# Patient Record
Sex: Male | Born: 1990 | Race: Black or African American | Hispanic: No | Marital: Single | Smoking: Former smoker
Health system: Southern US, Community
[De-identification: ages and names within clinical notes are randomized; demographics above are authoritative.]

## PROBLEM LIST (undated history)

## (undated) DIAGNOSIS — I509 Heart failure, unspecified: Secondary | ICD-10-CM

## (undated) DIAGNOSIS — J45909 Unspecified asthma, uncomplicated: Secondary | ICD-10-CM

## (undated) DIAGNOSIS — I639 Cerebral infarction, unspecified: Secondary | ICD-10-CM

## (undated) DIAGNOSIS — I219 Acute myocardial infarction, unspecified: Secondary | ICD-10-CM

## (undated) DIAGNOSIS — T7840XA Allergy, unspecified, initial encounter: Secondary | ICD-10-CM

## (undated) HISTORY — PX: KNEE SURGERY: SHX244

## (undated) HISTORY — PX: CORONARY STENT INTERVENTION: CATH118234

## (undated) HISTORY — DX: Unspecified asthma, uncomplicated: J45.909

## (undated) HISTORY — DX: Allergy, unspecified, initial encounter: T78.40XA

---
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
## 2013-08-25 ENCOUNTER — Ambulatory Visit: Payer: Federal, State, Local not specified - PPO

## 2013-08-25 ENCOUNTER — Ambulatory Visit (INDEPENDENT_AMBULATORY_CARE_PROVIDER_SITE_OTHER): Payer: Federal, State, Local not specified - PPO | Admitting: Physician Assistant

## 2013-08-25 VITALS — BP 135/88 | HR 77 | Temp 98.4°F | Resp 18 | Wt 274.0 lb

## 2013-08-25 DIAGNOSIS — S93602A Unspecified sprain of left foot, initial encounter: Secondary | ICD-10-CM

## 2013-08-25 DIAGNOSIS — S93609A Unspecified sprain of unspecified foot, initial encounter: Secondary | ICD-10-CM

## 2013-08-25 DIAGNOSIS — M25579 Pain in unspecified ankle and joints of unspecified foot: Secondary | ICD-10-CM

## 2013-08-25 DIAGNOSIS — M25572 Pain in left ankle and joints of left foot: Secondary | ICD-10-CM

## 2013-08-25 NOTE — Progress Notes (Signed)
  Subjective:    Patient ID: Nathan Chaney, male    DOB: October 13, 1991, 22 y.o.   MRN: [id number]  Ankle Pain    22 year old male presents for evaluation of left ankle pain s/p an injury last night while playing football.  States he was going for a tackle then 3 people subsequently fell on his left ankle causing an eversion injury.  Admits he heard and felt a pop. Has had continued pain and worsening bruising since the injury.  Has pain with weight bearing but has been walking on it.  No paresthesias or weakness.  Hx of ankle sprain in high school but no known fractures and no prior surgeries.  He has not taken any ibuprofen or tylenol. No ice applied yet.  Patient is healthy with no known medical problems.  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      Review of Systems  Musculoskeletal: Positive for arthralgias and gait problem (secondary to pain).  Skin: Positive for color change. Negative for rash and wound.       Objective:   Physical Exam  Constitutional: He is oriented to person, place, and time. He appears well-developed and well-nourished.  HENT:  Head: Normocephalic and atraumatic.  Right Ear: External ear normal.  Left Ear: External ear normal.  Eyes: Conjunctivae are normal.  Neck: Normal range of motion.  Cardiovascular: Normal rate.   Pulmonary/Chest: Effort normal.  Musculoskeletal:       Left ankle: He exhibits decreased range of motion (secondary to pain), swelling and ecchymosis (lateral and medial ankle). He exhibits no laceration and normal pulse. Tenderness. Lateral malleolus, medial malleolus and AITFL tenderness found.  Neurological: He is alert and oriented to person, place, and time.  Psychiatric: He has a normal mood and affect. His behavior is normal. Judgment and thought content normal.     [facility] reading (PRIMARY) by  Dr. Conley Rolls as no acute bony abnormality.       Assessment & Plan:  Pain in joint, ankle and foot, left - Plan: DG Ankle  Complete Left  Placed in tall Cam walker and crutches for comfort Recommend ibuprofen or tylenol as needed for pain Ice for 20 minutes 2-3 times daily. Elevate as much as possible Out of football until pain resolves.  Follow up here if symptoms are worsening or fail to improve.

## 2014-05-28 IMAGING — CR DG ANKLE COMPLETE 3+V*L*
2 series · 2 of 2 positions shown · non-contrast
Comparison: None.

CLINICAL DATA: Pain post trauma

EXAM:
LEFT ANKLE COMPLETE - 3+ VIEW

[AP]
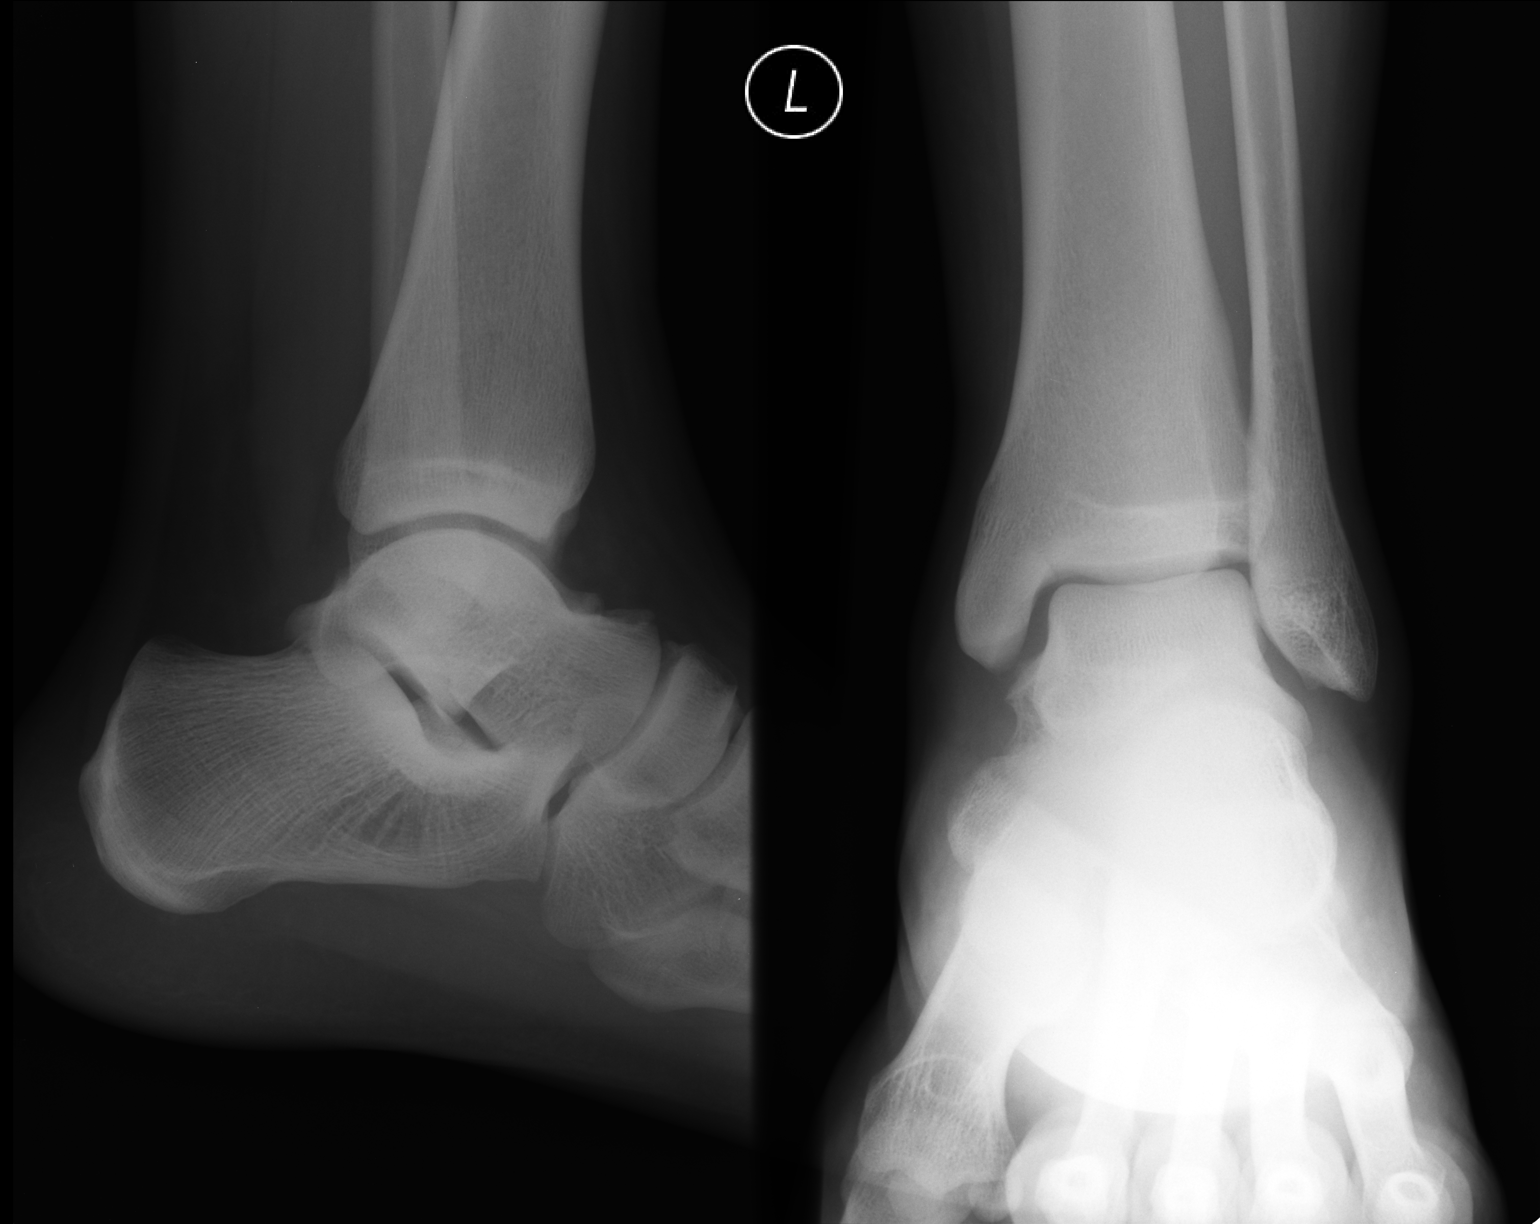

[ap obl int rot]
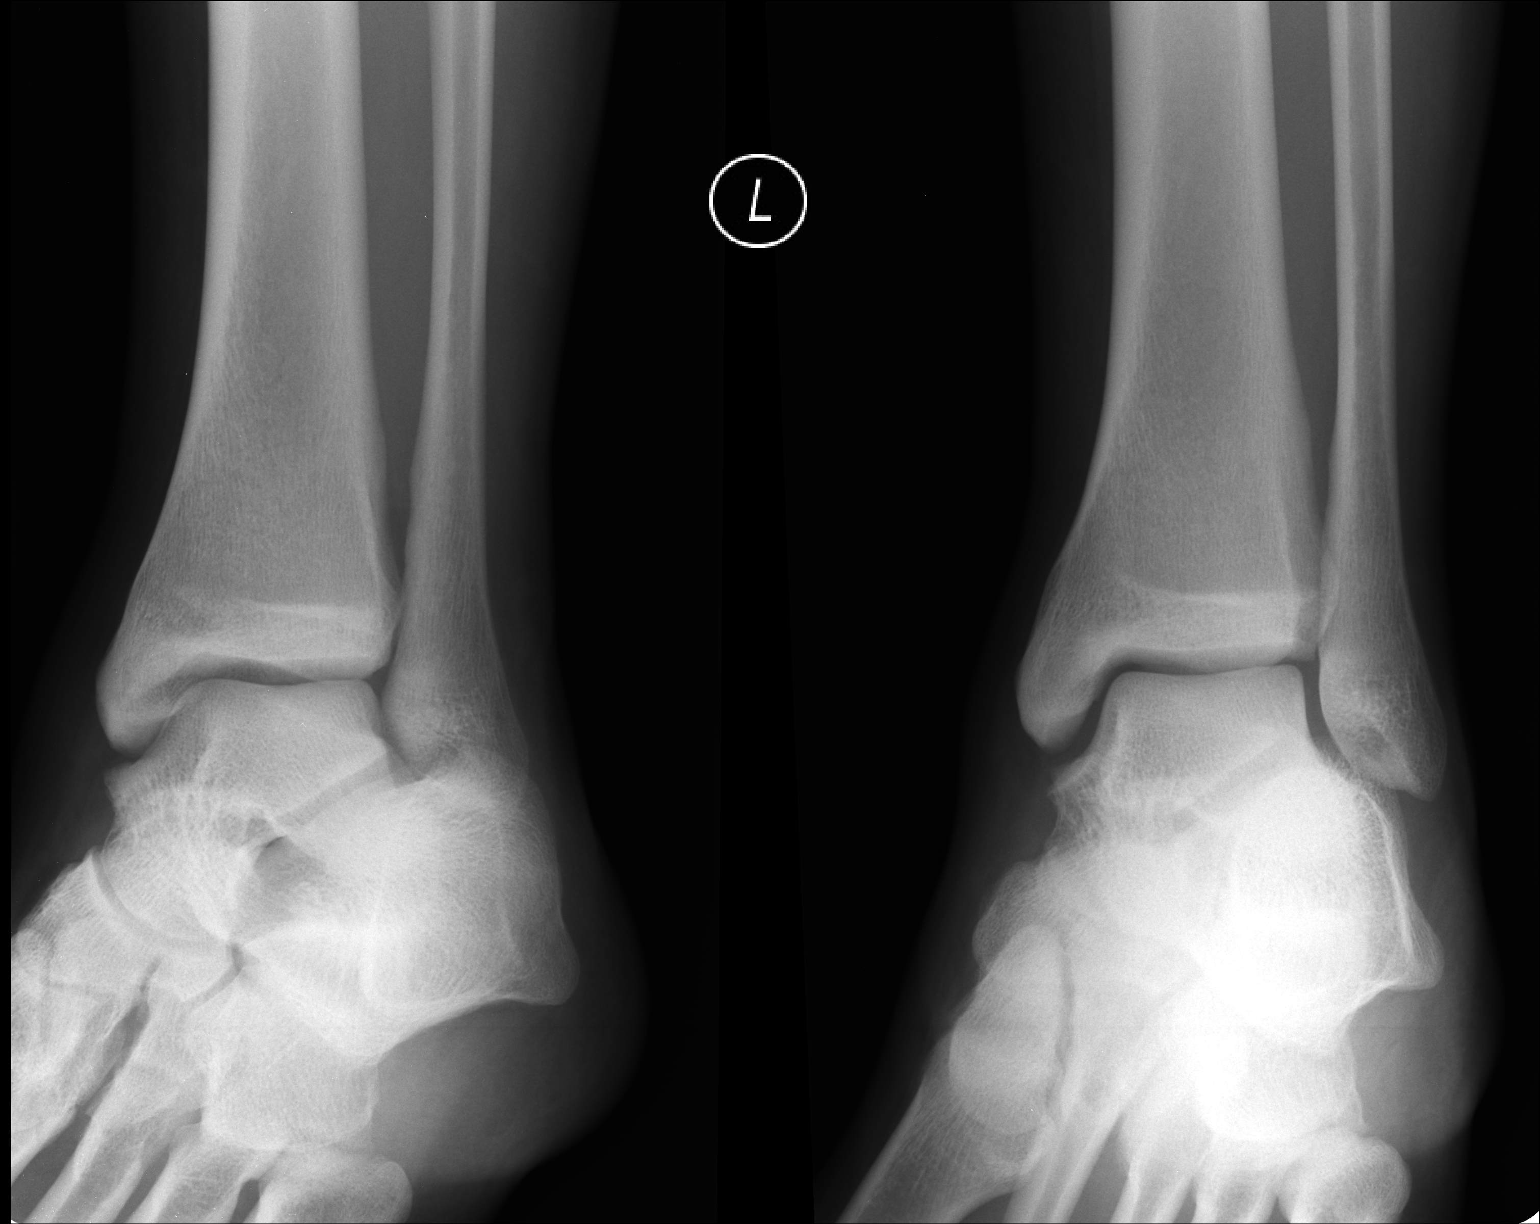

[2 of 2 positions shown; findings below may reference images not displayed]

FINDINGS: Frontal, oblique, and lateral views were obtained. There is no
fracture or effusion. Ankle mortise appears intact.
IMPRESSION: No abnormality noted.

## 2014-07-30 ENCOUNTER — Emergency Department (INDEPENDENT_AMBULATORY_CARE_PROVIDER_SITE_OTHER)
Admission: EM | Admit: 2014-07-30 | Discharge: 2014-07-30 | Disposition: A | Discharge status: Home / Self Care | Payer: Self-pay | Source: Home / Self Care | Attending: Emergency Medicine | Admitting: Emergency Medicine

## 2014-07-30 ENCOUNTER — Encounter (HOSPITAL_COMMUNITY): Payer: Self-pay | Admitting: Emergency Medicine

## 2014-07-30 DIAGNOSIS — J069 Acute upper respiratory infection, unspecified: Secondary | ICD-10-CM

## 2014-07-30 MED ORDER — IPRATROPIUM BROMIDE 0.06 % NA SOLN: 2.0000 | Freq: Four times a day (QID) | NASAL | Status: DC

## 2014-07-30 NOTE — ED Provider Notes (Signed)
CSN: [id number]     Arrival date & time 07/30/14  1418 History   First MD Initiated Contact with Patient 07/30/14 1505     Chief Complaint  Patient presents with  . URI   (Consider location/radiation/quality/duration/timing/severity/associated sxs/prior Treatment) Patient is a 23 y.o. male presenting with URI. The history is provided by the patient.  URI Presenting symptoms: congestion, cough and rhinorrhea   Presenting symptoms: no fatigue, no fever and no sore throat   Severity:  Mild Onset quality:  Gradual Duration:  2 days Timing:  Constant Progression:  Unchanged Chronicity:  New Associated symptoms: no arthralgias, no headaches, no myalgias, no neck pain, no sinus pain, no sneezing, no swollen glands and no wheezing     Past Medical History  Diagnosis Date  . Allergy   . Asthma    History reviewed. No pertinent past surgical history. No family history on file. History  Substance Use Topics  . Smoking status: Current Every Day Smoker    Types: Cigars  . Smokeless tobacco: Not on file  . Alcohol Use: Yes    Review of Systems  Constitutional: Negative for fever and fatigue.  HENT: Positive for congestion and rhinorrhea. Negative for sneezing and sore throat.   Respiratory: Positive for cough. Negative for wheezing.   Musculoskeletal: Negative for arthralgias, myalgias and neck pain.  Neurological: Negative for headaches.  All other systems reviewed and are negative.   Allergies  Review of patient's allergies indicates no known allergies.  Home Medications   Prior to Admission medications   Medication Sig Start Date End Date Taking? Authorizing Provider  ipratropium (ATROVENT) 0.06 % nasal spray Place 2 sprays into both nostrils 4 (four) times daily. 07/30/14   Jess BartersJennifer Lee H Presson, PA   BP 118/72  Pulse 78  Temp(Src) 98.6 F (37 C) (Oral)  Resp 16  SpO2 100% Physical Exam  Nursing note and vitals reviewed. Constitutional: He is oriented to person,  place, and time. He appears well-developed and well-nourished. No distress.  HENT:  Head: Normocephalic and atraumatic.  Right Ear: Hearing, tympanic membrane, external ear and ear canal normal.  Left Ear: Hearing, tympanic membrane, external ear and ear canal normal.  Nose: Mucosal edema and rhinorrhea present.  Mouth/Throat: Uvula is midline, oropharynx is clear and moist and mucous membranes are normal.  Eyes: Conjunctivae are normal. No scleral icterus.  Neck: Normal range of motion. Neck supple.  Cardiovascular: Normal rate, regular rhythm and normal heart sounds.   Pulmonary/Chest: Effort normal and breath sounds normal. No respiratory distress. He has no wheezes.  Musculoskeletal: Normal range of motion.  Lymphadenopathy:    He has no cervical adenopathy.  Neurological: He is alert and oriented to person, place, and time.  Skin: Skin is warm and dry.  Psychiatric: He has a normal mood and affect. His behavior is normal.    ED Course  Procedures (including critical care time) Labs Review Labs Reviewed - No data to display  Imaging Review No results found.   MDM   1. URI (upper respiratory infection)    Reports the symptom that bothers him the most is his nasal congestion. Will advise Delsym as directed on packaging for cough and will Rx Atrovent nasal spray for congestion along with additional symptomatic care at home. Follow up if no improvement over next 5-7 days.     Ria ClockJennifer Lee H Presson, GeorgiaPA 07/30/14 [phone]

## 2014-07-30 NOTE — ED Provider Notes (Signed)
Medical screening examination/treatment/procedure(s) were performed by non-physician practitioner and as supervising physician I was immediately available for consultation/collaboration.  Leslee Homeavid Judeen Geralds, M.D.  Reuben Likesavid C Rein Popov, MD 07/30/14 [phone]

## 2014-07-30 NOTE — Discharge Instructions (Signed)
Upper Respiratory Infection, Adult An upper respiratory infection (URI) is also sometimes known as the common cold. The upper respiratory tract includes the nose, sinuses, throat, trachea, and bronchi. Bronchi are the airways leading to the lungs. Most people improve within 1 week, but symptoms can last up to 2 weeks. A residual cough may last even longer.  CAUSES Many different viruses can infect the tissues lining the upper respiratory tract. The tissues become irritated and inflamed and often become very moist. Mucus production is also common. A cold is contagious. You can easily spread the virus to others by oral contact. This includes kissing, sharing a glass, coughing, or sneezing. Touching your mouth or nose and then touching a surface, which is then touched by another person, can also spread the virus. SYMPTOMS  Symptoms typically develop 1 to 3 days after you come in contact with a cold virus. Symptoms vary from person to person. They may include:  Runny nose.  Sneezing.  Nasal congestion.  Sinus irritation.  Sore throat.  Loss of voice (laryngitis).  Cough.  Fatigue.  Muscle aches.  Loss of appetite.  Headache.  Low-grade fever. DIAGNOSIS  You might diagnose your own cold based on familiar symptoms, since most people get a cold 2 to 3 times a year. Your caregiver can confirm this based on your exam. Most importantly, your caregiver can check that your symptoms are not due to another disease such as strep throat, sinusitis, pneumonia, asthma, or epiglottitis. Blood tests, throat tests, and X-rays are not necessary to diagnose a common cold, but they may sometimes be helpful in excluding other more serious diseases. Your caregiver will decide if any further tests are required. RISKS AND COMPLICATIONS  You may be at risk for a more severe case of the common cold if you smoke cigarettes, have chronic heart disease (such as heart failure) or lung disease (such as asthma), or if  you have a weakened immune system. The very young and very old are also at risk for more serious infections. Bacterial sinusitis, middle ear infections, and bacterial pneumonia can complicate the common cold. The common cold can worsen asthma and chronic obstructive pulmonary disease (COPD). Sometimes, these complications can require emergency medical care and may be life-threatening. PREVENTION  The best way to protect against getting a cold is to practice good hygiene. Avoid oral or hand contact with people with cold symptoms. Wash your hands often if contact occurs. There is no clear evidence that vitamin C, vitamin E, echinacea, or exercise reduces the chance of developing a cold. However, it is always recommended to get plenty of rest and practice good nutrition. TREATMENT  Treatment is directed at relieving symptoms. There is no cure. Antibiotics are not effective, because the infection is caused by a virus, not by bacteria. Treatment may include:  Increased fluid intake. Sports drinks offer valuable electrolytes, sugars, and fluids.  Breathing heated mist or steam (vaporizer or shower).  Eating chicken soup or other clear broths, and maintaining good nutrition.  Getting plenty of rest.  Using gargles or lozenges for comfort.  Controlling fevers with ibuprofen or acetaminophen as directed by your caregiver.  Increasing usage of your inhaler if you have asthma. Zinc gel and zinc lozenges, taken in the first 24 hours of the common cold, can shorten the duration and lessen the severity of symptoms. Pain medicines may help with fever, muscle aches, and throat pain. A variety of non-prescription medicines are available to treat congestion and runny nose. Your caregiver   can make recommendations and may suggest nasal or lung inhalers for other symptoms.  HOME CARE INSTRUCTIONS   Only take over-the-counter or prescription medicines for pain, discomfort, or fever as directed by your  caregiver.  Use a warm mist humidifier or inhale steam from a shower to increase air moisture. This may keep secretions moist and make it easier to breathe.  Drink enough water and fluids to keep your urine clear or pale yellow.  Rest as needed.  Return to work when your temperature has returned to normal or as your caregiver advises. You may need to stay home longer to avoid infecting others. You can also use a face mask and careful hand washing to prevent spread of the virus. SEEK MEDICAL CARE IF:   After the first few days, you feel you are getting worse rather than better.  You need your caregiver's advice about medicines to control symptoms.  You develop chills, worsening shortness of breath, or brown or red sputum. These may be signs of pneumonia.  You develop yellow or brown nasal discharge or pain in the face, especially when you bend forward. These may be signs of sinusitis.  You develop a fever, swollen neck glands, pain with swallowing, or white areas in the back of your throat. These may be signs of strep throat. SEEK IMMEDIATE MEDICAL CARE IF:   You have a fever.  You develop severe or persistent headache, ear pain, sinus pain, or chest pain.  You develop wheezing, a prolonged cough, cough up blood, or have a change in your usual mucus (if you have chronic lung disease).  You develop sore muscles or a stiff neck. Document Released: 04/12/2001 Document Revised: 01/09/2012 Document Reviewed: 01/22/2014 ExitCare Patient Information 2015 [employer]. This information is not intended to replace advice given to you by your health care provider. Make sure you discuss any questions you have with your health care provider.  

## 2014-07-30 NOTE — ED Notes (Signed)
Py  Reports  Symptoms  Of            Cough  Congestion     stuffynose          With  Symptoms     X  2  Days    History  Of  Asthma    With  No  Acute  Distress          Sitting  Upright on  The  Exam table  In no  Acute  Distress

## 2015-08-17 ENCOUNTER — Other Ambulatory Visit: Payer: Self-pay | Admitting: Physician Assistant

## 2015-08-17 NOTE — H&P (Signed)
Fayrene FearingJames is seen in consultation today at the request of Dr. Farris HasKramer for evaluation and treatment recommendation for his left knee.  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  Markedly exacerbated when he came down awkwardly at work sustaining an acute dislocation.  This has been evaluated by Dr. Farris HasKramer.  It has included x-rays, as well as MRI scan.  All of those reviewed.  He is playing semipro football and wants to return to a level to be able to continue that if possible.  No previous operative intervention.  Looking at the actual MRI which was from July, there was obviously a relatively marked acute event with significant edema lateral femoral condyle.  Very stretched, thinned, markedly attenuated medial patellofemoral ligament.   Remaining history and general exam reviewed.  Otherwise in great health.     EXAMINATION: Clinically he has marked patellofemoral instability and apprehension on the left.  Really none on the right.  His Q angles are not that excessive.  Patellar tendon and ligaments otherwise intact.  I think he tethers a little bit from the chronic situation on the left, but this is not too dramatic.  On the right his patella feels good.  His tracking is good.  There is no crepitus.  No tethering.  Neurovascularly intact distally.    X-RAYS: Ultrasound evaluation was performed.  Looking at the medial side of his knee, what is left of his medial patellofemoral ligament is so thinned and attenuated that really no structural ligament could be seen.  DISPOSITION:  Long talk with Fayrene FearingJames.  Need for treatment straightforward.  After his acute traumatic event this is now intolerable, even with activities of daily living.  There is no way he is going to get back to a high level of activity short of reconstruction and it really needs to be done because of his degree of symptoms.  I am very concerned about how much destruction he is doing to his patella with these recurrent events.  We  have discussed this at great length, more than 25 minutes spent face-to-face.  Exam under anesthesia, arthroscopy.  Open medial patellofemoral ligament reconstruction utilizing allograft.  Adding lateral release if necessary.  Chondroplasty patella as well.  Anticipated rehab, recovery and outcome outlined.  At the patient's request, I have also spoken to his mother on the phone explaining all of this to her as well.  Paperwork complete.  All questions answered.  See him at the time of operative intervention.      Loreta Aveaniel F. Murphy, M.D.

## 2015-09-10 ENCOUNTER — Encounter (HOSPITAL_BASED_OUTPATIENT_CLINIC_OR_DEPARTMENT_OTHER): Admission: RE | Payer: Self-pay | Source: Ambulatory Visit

## 2015-09-10 ENCOUNTER — Ambulatory Visit (HOSPITAL_BASED_OUTPATIENT_CLINIC_OR_DEPARTMENT_OTHER): Admission: RE | Admit: 2015-09-10 | Payer: Self-pay | Source: Ambulatory Visit | Admitting: Orthopedic Surgery

## 2015-09-10 SURGERY — RECONSTRUCTION, LIGAMENT, MEDIAL PATELLOFEMORAL
Anesthesia: General | Site: Knee | Laterality: Left

## 2016-04-25 ENCOUNTER — Encounter (HOSPITAL_BASED_OUTPATIENT_CLINIC_OR_DEPARTMENT_OTHER): Payer: Self-pay

## 2016-04-25 ENCOUNTER — Emergency Department (HOSPITAL_BASED_OUTPATIENT_CLINIC_OR_DEPARTMENT_OTHER)
Admission: EM | Admit: 2016-04-25 | Discharge: 2016-04-25 | Disposition: A | Discharge status: Home / Self Care | Payer: Self-pay | Attending: Emergency Medicine | Admitting: Emergency Medicine

## 2016-04-25 DIAGNOSIS — Z87891 Personal history of nicotine dependence: Secondary | ICD-10-CM | POA: Insufficient documentation

## 2016-04-25 DIAGNOSIS — K029 Dental caries, unspecified: Secondary | ICD-10-CM | POA: Insufficient documentation

## 2016-04-25 DIAGNOSIS — J45909 Unspecified asthma, uncomplicated: Secondary | ICD-10-CM | POA: Insufficient documentation

## 2016-04-25 DIAGNOSIS — R51 Headache: Secondary | ICD-10-CM

## 2016-04-25 DIAGNOSIS — R519 Headache, unspecified: Secondary | ICD-10-CM

## 2016-04-25 DIAGNOSIS — Z791 Long term (current) use of non-steroidal anti-inflammatories (NSAID): Secondary | ICD-10-CM | POA: Insufficient documentation

## 2016-04-25 DIAGNOSIS — Z79891 Long term (current) use of opiate analgesic: Secondary | ICD-10-CM | POA: Insufficient documentation

## 2016-04-25 MED ORDER — NAPROXEN 375 MG PO TABS: 375.0000 mg | ORAL_TABLET | Freq: Two times a day (BID) | ORAL | Status: DC

## 2016-04-25 MED ORDER — TRAMADOL HCL 50 MG PO TABS: 50.0000 mg | ORAL_TABLET | Freq: Four times a day (QID) | ORAL | Status: DC | PRN

## 2016-04-25 NOTE — ED Notes (Signed)
Pt c/o left side tooth ache x 1 month and thinks that is causing a headache that he has had for 2 days

## 2016-04-25 NOTE — ED Provider Notes (Signed)
CSN: [id number]     Arrival date & time 04/25/16  1824 History   First MD Initiated Contact with Patient 04/25/16 2101     Chief Complaint  Patient presents with  . Headache     (Consider location/radiation/quality/duration/timing/severity/associated sxs/prior Treatment) HPI  Nathan SchickJames E Meche Jr. is a(n) 25 y.o. male who presents with cc of dental pain and headache. He has hadHad it in intermittent headaches from the left side of his upper jaw, into his ear and face with associated photophobia and very bright light. Patient has been using over-the-counter medications without relief of his symptoms. The pain is made worse with biting, hot or cold on the molar, he denies difficulty swallowing or breathing. He denies fevers or chills. Denies photophobia, phonophobia, UL throbbing, N/V, visual changes, stiff neck, neck pain, rash, or "thunderclap" onset.     Past Medical History  Diagnosis Date  . Allergy   . Asthma    Past Surgical History  Procedure Laterality Date  . Knee surgery     No family history on file. Social History  Substance Use Topics  . Smoking status: Former Games developermoker  . Smokeless tobacco: None  . Alcohol Use: Yes     Comment: occ    Review of Systems  Ten systems reviewed and are negative for acute change, except as noted in the HPI.     Allergies  Mustard seed  Home Medications   Prior to Admission medications   Medication Sig Start Date End Date Taking? Authorizing Provider  HYDROcodone-acetaminophen (NORCO/VICODIN) 5-325 MG tablet Take 1 tablet by mouth every 6 (six) hours as needed for moderate pain.   Yes Historical Provider, MD  ibuprofen (ADVIL,MOTRIN) 800 MG tablet Take 800 mg by mouth every 8 (eight) hours as needed.   Yes Historical Provider, MD   BP 129/92 mmHg  Pulse 70  Temp(Src) 98.5 F (36.9 C) (Oral)  Resp 18  Ht 6\' 3"  (1.905 m)  Wt 131.543 kg  BMI 36.25 kg/m2  SpO2 99% Physical Exam  Constitutional: He is oriented to person, place,  and time. He appears well-developed and well-nourished. No distress.  HENT:  Head: Normocephalic and atraumatic.  Mouth/Throat: Oropharynx is clear and moist.  Left upper second molar with sig decay and old tooth fracture.  no signs of abscess, Oropharynx without swelling.    Eyes: Conjunctivae and EOM are normal. Pupils are equal, round, and reactive to light. No scleral icterus.  No horizontal, vertical or rotational nystagmus  Neck: Normal range of motion. Neck supple.  Full active and passive ROM without pain No midline or paraspinal tenderness No nuchal rigidity or meningeal signs  Cardiovascular: Normal rate, regular rhythm and intact distal pulses.  Exam reveals no friction rub.   Pulmonary/Chest: Effort normal and breath sounds normal. No respiratory distress. He has no wheezes. He has no rales.  Abdominal: Soft. Bowel sounds are normal. There is no tenderness. There is no rebound and no guarding.  Musculoskeletal: Normal range of motion.  Lymphadenopathy:    He has no cervical adenopathy.  Neurological: He is alert and oriented to person, place, and time. He has normal reflexes. No cranial nerve deficit. He exhibits normal muscle tone. Coordination normal.  Mental Status:  Alert, oriented, thought content appropriate. Speech fluent without evidence of aphasia. Able to follow 2 step commands without difficulty.  Cranial Nerves:  II:  Peripheral visual fields grossly normal, pupils equal, round, reactive to light III,IV, VI: ptosis not present, extra-ocular motions intact bilaterally  V,VII: smile symmetric, facial light touch sensation equal VIII: hearing grossly normal bilaterally  IX,X: midline uvula rise  XI: bilateral shoulder shrug equal and strong XII: midline tongue extension  Motor:  5/5 in upper and lower extremities bilaterally including strong and equal grip strength and dorsiflexion/plantar flexion Sensory: Pinprick and light touch normal in all extremities.   Deep Tendon Reflexes: 2+ and symmetric  Cerebellar: normal finger-to-nose with bilateral upper extremities Gait: normal gait and balance CV: distal pulses palpable throughout   Skin: Skin is warm and dry. No rash noted. He is not diaphoretic.  Psychiatric: He has a normal mood and affect. His behavior is normal. Judgment and thought content normal.  Nursing note and vitals reviewed.   ED Course  Procedures (including critical care time) Labs Review Labs Reviewed - No data to display  Imaging Review No results found. I have personally reviewed and evaluated these images and lab results as part of my medical decision-making.   EKG Interpretation None      MDM   Final diagnoses:  Pain due to dental caries  Bad headache   Pt HA  not concerning for SAH, ICH, Meningitis, or temporal arteritis. Pt is afebrile with no focal neuro deficits, nuchal rigidity, or change in vision.  Patient with toothache.  No gross abscess.  Exam unconcerning for Ludwig's angina or spread of infection.  Will treat with pain medicine.  Urged patient to follow-up with dentist.    .    Arthor CaptainAbigail Farrell Broerman, PA-C 04/25/16 [id number]  Alvira MondayErin Schlossman, MD 04/26/16 [phone]

## 2016-04-25 NOTE — Discharge Instructions (Signed)
SEEK MEDICAL ATTENTION IF: You develop possible problems with medications prescribed.  The medications don't resolve your headache, if it recurs , or if you have multiple episodes of vomiting or can't take fluids. You have a change from the usual headache. RETURN IMMEDIATELY IF you develop a sudden, severe headache or confusion, become poorly responsive or faint, develop a fever above 100.74F or problem breathing, have a change in speech, vision, swallowing, or understanding, or develop new weakness, numbness, tingling, incoordination, or have a seizure.   Dental Caries Dental caries (also called tooth decay) is the most common oral disease. It can occur at any age but is more common in children and young adults.  HOW DENTAL CARIES DEVELOPS  The process of decay begins when bacteria and foods (particularly sugars and starches) combine in your mouth to produce plaque. Plaque is a substance that sticks to the hard, outer surface of a tooth (enamel). The bacteria in plaque produce acids that attack enamel. These acids may also attack the root surface of a tooth (cementum) if it is exposed. Repeated attacks dissolve these surfaces and create holes in the tooth (cavities). If left untreated, the acids destroy the other layers of the tooth.  RISK FACTORS  Frequent sipping of sugary beverages.   Frequent snacking on sugary and starchy foods, especially those that easily get stuck in the teeth.   Poor oral hygiene.   Dry mouth.   Substance abuse such as methamphetamine abuse.   Broken or poor-fitting dental restorations.   Eating disorders.   Gastroesophageal reflux disease (GERD).   Certain radiation treatments to the head and neck. SYMPTOMS In the early stages of dental caries, symptoms are seldom present. Sometimes white, chalky areas may be seen on the enamel or other tooth layers. In later stages, symptoms may include:  Pits and holes on the enamel.  Toothache after sweet,  hot, or cold foods or drinks are consumed.  Pain around the tooth.  Swelling around the tooth. DIAGNOSIS  Most of the time, dental caries is detected during a regular dental checkup. A diagnosis is made after a thorough medical and dental history is taken and the surfaces of your teeth are checked for signs of dental caries. Sometimes special instruments, such as lasers, are used to check for dental caries. Dental X-ray exams may be taken so that areas not visible to the eye (such as between the contact areas of the teeth) can be checked for cavities.  TREATMENT  If dental caries is in its early stages, it may be reversed with a fluoride treatment or an application of a remineralizing agent at the dental office. Thorough brushing and flossing at home is needed to aid these treatments. If it is in its later stages, treatment depends on the location and extent of tooth destruction:   If a small area of the tooth has been destroyed, the destroyed area will be removed and cavities will be filled with a material such as gold, silver amalgam, or composite resin.   If a large area of the tooth has been destroyed, the destroyed area will be removed and a cap (crown) will be fitted over the remaining tooth structure.   If the center part of the tooth (pulp) is affected, a procedure called a root canal will be needed before a filling or crown can be placed.   If most of the tooth has been destroyed, the tooth may need to be pulled (extracted). HOME CARE INSTRUCTIONS You can prevent, stop, or  reverse dental caries at home by practicing good oral hygiene. Good oral hygiene includes:  Thoroughly cleaning your teeth at least twice a day with a toothbrush and dental floss.   Using a fluoride toothpaste. A fluoride mouth rinse may also be used if recommended by your dentist or health care provider.   Restricting the amount of sugary and starchy foods and sugary liquids you consume.   Avoiding  frequent snacking on these foods and sipping of these liquids.   Keeping regular visits with a dentist for checkups and cleanings. PREVENTION   Practice good oral hygiene.  Consider a dental sealant. A dental sealant is a coating material that is applied by your dentist to the pits and grooves of teeth. The sealant prevents food from being trapped in them. It may protect the teeth for several years.  Ask about fluoride supplements if you live in a community without fluorinated water or with water that has a low fluoride content. Use fluoride supplements as directed by your dentist or health care provider.  Allow fluoride varnish applications to teeth if directed by your dentist or health care provider.   This information is not intended to replace advice given to you by your health care provider. Make sure you discuss any questions you have with your health care provider.   Document Released: 07/09/2002 Document Revised: 11/07/2014 Document Reviewed: 10/19/2012 Elsevier Interactive Patient Education 2016 Elsevier Inc.  Dental Pain Dental pain may be caused by many things, including:  Tooth decay (cavities or caries). Cavities expose the nerve of your tooth to air and hot or cold temperatures. This can cause pain or discomfort.  Abscess or infection. A dental abscess is a collection of infected pus from a bacterial infection in the inner part of the tooth (pulp). It usually occurs at the end of the tooth's root.  Injury.  An unknown reason (idiopathic). Your pain may be mild or severe. It may only occur when:  You are chewing.  You are exposed to hot or cold temperature.  You are eating or drinking sugary foods or beverages, such as soda or candy. Your pain may also be constant. HOME CARE INSTRUCTIONS Watch your dental pain for any changes. The following actions may help to lessen any discomfort that you are feeling:  Take medicines only as directed by your dentist.  If you  were prescribed an antibiotic medicine, finish all of it even if you start to feel better.  Keep all follow-up visits as directed by your dentist. This is important.  Do not apply heat to the outside of your face.  Rinse your mouth or gargle with salt water if directed by your dentist. This helps with pain and swelling.  You can make salt water by adding  tsp of salt to 1 cup of warm water.  Apply ice to the painful area of your face:  Put ice in a plastic bag.  Place a towel between your skin and the bag.  Leave the ice on for 20 minutes, 2-3 times per day.  Avoid foods or drinks that cause you pain, such as:  Very hot or very cold foods or drinks.  Sweet or sugary foods or drinks. SEEK MEDICAL CARE IF:  Your pain is not controlled with medicines.  Your symptoms are worse.  You have new symptoms. SEEK IMMEDIATE MEDICAL CARE IF:  You are unable to open your mouth.  You are having trouble breathing or swallowing.  You have a fever.  Your face,  neck, or jaw is swollen.   This information is not intended to replace advice given to you by your health care provider. Make sure you discuss any questions you have with your health care provider.   Document Released: 10/17/2005 Document Revised: 03/03/2015 Document Reviewed: 10/13/2014 Elsevier Interactive Patient Education 2016 Elsevier Inc. [facility] of Dental Medicine  Community Service Learning [facility]  [address]  [place], [place] [address]  Phone [phone]  The [employer] [facility] in [place] exemplifies the [employer] vision to improve the health and quality of life of all [place]ians by Public house manager with a passion to care for the underserved and by leading the nation in community-based, service learning oral health education. We are committed to offering comprehensive  general dental services for adults, children and special needs patients in a safe, caring and professional setting.  Appointments: Our clinic is open Monday through Friday 8:00 a.m. until 5:00 p.m. The amount of time scheduled for an appointment depends on the patients specific needs. We ask that you keep your appointed time for care or provide 24-hour notice of all appointment changes. Parents or legal guardians must accompany minor children.  Payment for Services: Medicaid and other insurance plans are welcome. Payment for services is due when services are rendered and may be made by cash or credit card. If you have dental insurance, we will assist you with your claim submission.   Emergencies: Emergency services will be provided Monday through Friday on a walk-in basis. Please arrive early for emergency services. After hours emergency services will be provided for patients of record as required.  Services:  Medical illustrator Dentistry  Oral Surgery - Extractions  Root Canals  Sealants and Tooth Colored Fillings  Crowns and Bridges  Dentures and Partial Dentures  Implant Services  Periodontal Services and Cleanings  Cosmetic Building services engineer  [employer]  [employer] Guide Dental [employer] is a great source of information about community services available.  Access by dialing 2-1-1 from anywhere in [place], or by website -  PooledIncome.pl.   Other Local Resources (Updated 11/2015)  Dental  Care   Services    Phone Number and Address  Cost  [facility] For children 53 - 25 years of age:   Cleaning  Tooth brushing/flossing instruction  Sealants, fillings, crowns  Extractions  Emergency treatment  [phone] [address] [place], [place] [address] Charges based on family income.  Medicaid and some insurance plans accepted.     [facility] - [employer], fillings, crowns  Extractions  Emergency treatment [phone] [address] [place], [place]  Pregnant women 16 years of age or older with a Medicaid card  [place] - [place]  Cleaning  Sealants, fillings, crowns  Extractions  Emergency treatment [phone] [address] [place], [place] Pregnant women 81 years of age or older with a Medicaid card  [facility] For children 27 - 54 years of age:   Cleaning  Tooth brushing/flossing instruction  Sealants, fillings, crowns  Extractions  Emergency treatment Limited orthodontic services for patients with Medicaid [phone] [address] [place], [place] [address] Medicaid and [facility] Health Choice cover for children up to age 106 and pregnant women.  Parents of children up to age 26 without Medicaid pay a reduced fee at time of service.  [facility] [place] For children 390 - 26 years of age:   Cleaning  Tooth brushing/flossing instruction  Sealants, fillings, crowns  Extractions  Emergency treatment Limited orthodontic services for patients with Medicaid [phone] [address] [place], [place].  Medicaid and [place] Health Choice cover for children up to age 26 and pregnant women.  Parents of children up to age 26 without Medicaid pay a reduced fee.  [facility]  Cleaning  Sealants, fillings, crowns  Extractions  Hours: Tuesdays and Thursdays, 4:15 - 8 pm [phone] [address] [place], [place] [address] Services free of charge to [facility] residents ages 18-64 who do not have health insurance, Medicare, [place], or [place] benefits and fall within federal poverty guidelines  [facility]    Provides dental care in addition to primary medical care,  nutritional counseling, and pharmacy:  Nurse, mental healthCleaning  Sealants, fillings, crowns  Extractions                  [phone] [facility], [address] [place], [place]  [phone] Phineas Realharles Drew [facility], [address] [place], [place]  [phone] [facility] [place], [place]  [phone] [facility], [address] [place], [place]  [phone] [facility] [address] [place], [place] Accepts [place], [place], most insurance.  Also provides services available to all with fees adjusted based on ability to pay.    [facility]  Cleaning  Tooth brushing/flossing instruction  Sealants, fillings, crowns  Extractions  Emergency treatment Hours: Tuesdays, Thursdays, and Fridays from 8 am to 5 pm by appointment only. [phone] [address] [place], [place] [id number] [facility] residents with Medicaid (depending on eligibility) and children with [facility] Health Choice - call for more information.  [facility]  Extractions only  Hours: 2nd and 4th Thursday of each month from 6:30 am - 9 am.   [phone] [address] [place], [place] [address] Ages 8418 and older only.  Patients are seen on a first come, first served basis.  [employer]  [employer]  Fillings  Extractions  Orthodontics  Endodontics  Implants/Crowns/Bridges  Complete and partial dentures [phone] [place] Patients must complete an application for services.  There is often a waiting list.

## 2016-04-25 NOTE — ED Notes (Signed)
C/o pain to left side HA x 2 days (temporal and head-thinks may be r/t to "a bad tooth")-denies injury, visual disturbance-NAD-steady gait

## 2016-05-21 ENCOUNTER — Emergency Department (HOSPITAL_COMMUNITY)
Admission: EM | Admit: 2016-05-21 | Discharge: 2016-05-21 | Disposition: A | Discharge status: Home / Self Care | Payer: Self-pay | Attending: Emergency Medicine | Admitting: Emergency Medicine

## 2016-05-21 ENCOUNTER — Encounter (HOSPITAL_COMMUNITY): Payer: Self-pay | Admitting: Emergency Medicine

## 2016-05-21 DIAGNOSIS — Z87891 Personal history of nicotine dependence: Secondary | ICD-10-CM | POA: Insufficient documentation

## 2016-05-21 DIAGNOSIS — R197 Diarrhea, unspecified: Secondary | ICD-10-CM | POA: Insufficient documentation

## 2016-05-21 DIAGNOSIS — J45909 Unspecified asthma, uncomplicated: Secondary | ICD-10-CM | POA: Insufficient documentation

## 2016-05-21 DIAGNOSIS — R112 Nausea with vomiting, unspecified: Secondary | ICD-10-CM | POA: Insufficient documentation

## 2016-05-21 DIAGNOSIS — Z79899 Other long term (current) drug therapy: Secondary | ICD-10-CM | POA: Insufficient documentation

## 2016-05-21 LAB — COMPREHENSIVE METABOLIC PANEL
ALT: 21 U/L (ref 17–63)
AST: 21 U/L (ref 15–41)
Albumin: 4.2 g/dL (ref 3.5–5.0)
Alkaline Phosphatase: 65 U/L (ref 38–126)
Anion gap: 5 (ref 5–15)
BUN: 11 mg/dL (ref 6–20)
CO2: 29 mmol/L (ref 22–32)
Calcium: 9.3 mg/dL (ref 8.9–10.3)
Chloride: 106 mmol/L (ref 101–111)
Creatinine, Ser: 1.37 mg/dL — ABNORMAL HIGH (ref 0.61–1.24)
Glucose, Bld: 98 mg/dL (ref 65–99)
POTASSIUM: 4.1 mmol/L (ref 3.5–5.1)
SODIUM: 140 mmol/L (ref 135–145)
Total Bilirubin: 1.2 mg/dL (ref 0.3–1.2)
Total Protein: 6.8 g/dL (ref 6.5–8.1)

## 2016-05-21 LAB — CBC
HEMATOCRIT: 42.9 % (ref 39.0–52.0)
HEMOGLOBIN: 14.4 g/dL (ref 13.0–17.0)
MCH: 29.1 pg (ref 26.0–34.0)
MCHC: 33.6 g/dL (ref 30.0–36.0)
MCV: 86.8 fL (ref 78.0–100.0)
PLATELETS: 189 10*3/uL (ref 150–400)
RBC: 4.94 MIL/uL (ref 4.22–5.81)
RDW: 13.3 % (ref 11.5–15.5)
WBC: 5.6 10*3/uL (ref 4.0–10.5)

## 2016-05-21 LAB — URINALYSIS, ROUTINE W REFLEX MICROSCOPIC
Bilirubin Urine: NEGATIVE
GLUCOSE, UA: NEGATIVE mg/dL
HGB URINE DIPSTICK: NEGATIVE
Ketones, ur: NEGATIVE mg/dL
LEUKOCYTES UA: NEGATIVE
Nitrite: NEGATIVE
PH: 5.5 (ref 5.0–8.0)
PROTEIN: NEGATIVE mg/dL
SPECIFIC GRAVITY, URINE: 1.017 (ref 1.005–1.030)

## 2016-05-21 LAB — LIPASE, BLOOD: LIPASE: 34 U/L (ref 11–51)

## 2016-05-21 MED ORDER — ONDANSETRON 4 MG PO TBDP: 4.0000 mg | ORAL_TABLET | Freq: Three times a day (TID) | ORAL | Status: DC | PRN

## 2016-05-21 NOTE — ED Notes (Signed)
EDP at bedside  

## 2016-05-21 NOTE — ED Notes (Signed)
Pt. reports emesis , diarrhea and mild abdominal discomfort onset last night , denies fever or chills.

## 2016-05-21 NOTE — ED Provider Notes (Signed)
CSN: [id number]     Arrival date & time 05/21/16  0145 History   First MD Initiated Contact with Patient 05/21/16 0408     Chief Complaint  Patient presents with  . Emesis  . Diarrhea     (Consider location/radiation/quality/duration/timing/severity/associated sxs/prior Treatment) Patient is a 25 y.o. male presenting with vomiting, diarrhea, and general illness. The history is provided by the patient.  Emesis Associated symptoms: abdominal pain and diarrhea   Associated symptoms: no arthralgias, no chills, no headaches and no myalgias   Diarrhea Associated symptoms: abdominal pain and vomiting   Associated symptoms: no arthralgias, no chills, no fever, no headaches and no myalgias   Illness Severity:  Mild Onset quality:  Sudden Duration:  2 hours Timing:  Constant Progression:  Partially resolved Chronicity:  New Associated symptoms: abdominal pain, diarrhea, nausea and vomiting   Associated symptoms: no chest pain, no congestion, no fever, no headaches, no myalgias, no rash and no shortness of breath    25 yo M With a chief complaints of nausea vomiting diarrhea. This went on for a few short hours after having Dione Plover. He thinks his mostly resolved. He is specifically asked for a work note. Denies fevers or chills. Denies bloody diarrhea. Denies bilious emesis.  Past Medical History  Diagnosis Date  . Allergy   . Asthma    Past Surgical History  Procedure Laterality Date  . Knee surgery     No family history on file. Social History  Substance Use Topics  . Smoking status: Former Games developer  . Smokeless tobacco: None  . Alcohol Use: Yes     Comment: occ    Review of Systems  Constitutional: Negative for fever and chills.  HENT: Negative for congestion and facial swelling.   Eyes: Negative for discharge and visual disturbance.  Respiratory: Negative for shortness of breath.   Cardiovascular: Negative for chest pain and palpitations.  Gastrointestinal: Positive for  nausea, vomiting, abdominal pain and diarrhea.  Musculoskeletal: Negative for myalgias and arthralgias.  Skin: Negative for color change and rash.  Neurological: Negative for tremors, syncope and headaches.  Psychiatric/Behavioral: Negative for confusion and dysphoric mood.      Allergies  Mustard seed  Home Medications   Prior to Admission medications   Medication Sig Start Date End Date Taking? Authorizing Provider  naproxen (NAPROSYN) 375 MG tablet Take 1 tablet (375 mg total) by mouth 2 (two) times daily. 04/25/16   Arthor Captain, PA-C  ondansetron (ZOFRAN ODT) 4 MG disintegrating tablet Take 1 tablet (4 mg total) by mouth every 8 (eight) hours as needed for nausea or vomiting. 05/21/16   Melene Plan, DO  traMADol (ULTRAM) 50 MG tablet Take 1 tablet (50 mg total) by mouth every 6 (six) hours as needed. 04/25/16   Abigail Harris, PA-C   BP 136/94 mmHg  Pulse 77  Temp(Src) 98.3 F (36.8 C) (Oral)  Resp 16  Ht  (1.88 m)  Wt 292 lb (132.45 kg)  BMI 37.47 kg/m2  SpO2 99% Physical Exam  Constitutional: He is oriented to person, place, and time. He appears well-developed and well-nourished.  HENT:  Head: Normocephalic and atraumatic.  Eyes: EOM are normal. Pupils are equal, round, and reactive to light.  Neck: Normal range of motion. Neck supple. No JVD present.  Cardiovascular: Normal rate and regular rhythm.  Exam reveals no gallop and no friction rub.   No murmur heard. Pulmonary/Chest: No respiratory distress. He has no wheezes.  Abdominal: He exhibits no distension.  There is no tenderness. There is no rebound and no guarding.  Musculoskeletal: Normal range of motion.  Neurological: He is alert and oriented to person, place, and time.  Skin: No rash noted. No pallor.  Psychiatric: He has a normal mood and affect. His behavior is normal.  Nursing note and vitals reviewed.   ED Course  Procedures (including critical care time) Labs Review Labs Reviewed  COMPREHENSIVE  METABOLIC PANEL - Abnormal; Notable for the following:    Creatinine, Ser 1.37 (*)    All other components within normal limits  LIPASE, BLOOD  CBC  URINALYSIS, ROUTINE W REFLEX MICROSCOPIC (NOT AT [facility])    Imaging Review No results found. I have personally reviewed and evaluated these images and lab results as part of my medical decision-making.   EKG Interpretation None      MDM   Final diagnoses:  Nausea vomiting and diarrhea    25 yo M with nausea vomiting and diarrhea after suspicious food intake. Patient is well-appearing and nontoxic. He had laboratory evaluation done through triage that was unremarkable. He has a benign abdominal exam. Discharge home.  4:57 AM:  I have discussed the diagnosis/risks/treatment options with the patient and believe the pt to be eligible for discharge home to follow-up with PCP. We also discussed returning to the ED immediately if new or worsening sx occur. We discussed the sx which are most concerning (e.g., sudden worsening pain, fever, inability to tolerate by mouth) that necessitate immediate return. Medications administered to the patient during their visit and any new prescriptions provided to the patient are listed below.  Medications given during this visit Medications - No data to display  Discharge Medication List as of 05/21/2016  4:45 AM    START taking these medications   Details  ondansetron (ZOFRAN ODT) 4 MG disintegrating tablet Take 1 tablet (4 mg total) by mouth every 8 (eight) hours as needed for nausea or vomiting., Starting 05/21/2016, Until Discontinued, Print        The patient appears reasonably screen and/or stabilized for discharge and I doubt any other medical condition or other [facility] requiring further screening, evaluation, or treatment in the ED at this time prior to discharge.      Melene Plan, DO 05/21/16 [phone]

## 2016-05-21 NOTE — Discharge Instructions (Signed)
Take imodium for diarrhea.  Rotavirus Infection Rotaviruses are a group of viruses that cause acute stomach and bowel upset (gastroenteritis) in all ages. Rotavirus infection may also be called infantile diarrhea, winter diarrhea, acute nonbacterial infectious gastroenteritis, and acute viral gastroenteritis. It occurs especially in young children. Children 6 months to 26 years of age, premature infants, the elderly, and the immunocompromised are more likely to have severe symptoms.  CAUSES  Rotaviruses are transmitted by the fecal-oral route. This means the virus is spread by eating or drinking food or water that is contaminated with infected stool. The virus is most commonly spread from person to person when someone's hands are contaminated with infected stool. For example, infected food handlers may contaminate foods. This can occur with foods that require handling and no further cooking, such as salads, fruits, and hors d'oeuvres. Rotaviruses are quite stable. They can be hard to control and eliminate in water supplies. Rotaviruses are a common cause of infection and diarrhea in child-care settings. SYMPTOMS  Some children have no symptoms. The period after infection but before symptoms begin (incubation period) ranges from 1 to 3 days. Symptoms usually begin with vomiting. Diarrhea follows for 4 to 8 days. Other symptoms may include:  Low-grade fever.  Temporary dairy (lactose) intolerance.  Cough.  Runny nose. DIAGNOSIS  The disease is diagnosed by identifying the virus in the stool. A person with rotavirus diarrhea often has large numbers of viruses in his or her stool. TREATMENT  There is no cure for rotavirus infection. Most people develop an immune response that eventually gets rid of the virus. While this natural response develops, the virus can make you very ill. The majority of people affected are young infants, so the disease can be dangerous. The most common symptom is diarrhea.  Diarrhea alone can cause severe dehydration. It can also cause an electrolyte imbalance. Treatments are aimed at rehydration. Rehydration treatment can prevent the severe effects of dehydration. Antidiarrheal medicines are not recommended. Such medicines may prolong the infection, since they prevent you from passing the viruses out of your body. Severe diarrhea without fluid and electrolyte replacement may be life threatening. HOME CARE INSTRUCTIONS Ask your health care provider for specific rehydration instructions. SEEK IMMEDIATE MEDICAL CARE IF:   There is decreased urination.  You have a dry mouth, tongue, or lips.  You notice decreased tears or sunken eyes.  You have dry skin.  Your breathing is fast.  Your fingertip takes more than 2 seconds to turn pink again after a gentle squeeze.  There is blood in your vomit or stool.  Your abdomen is enlarged (distended) or very tender.  There is persistent vomiting. Most of this information is courtesy of the Center for Disease Control and Prevention of Food Illness Fact Sheet.   This information is not intended to replace advice given to you by your health care provider. Make sure you discuss any questions you have with your health care provider.   Document Released: 10/17/2005 Document Revised: 11/07/2014 Document Reviewed: 01/13/2011 Elsevier Interactive Patient Education [employer]

## 2016-07-13 ENCOUNTER — Emergency Department (HOSPITAL_COMMUNITY)
Admission: EM | Admit: 2016-07-13 | Discharge: 2016-07-13 | Disposition: A | Discharge status: Home / Self Care | Payer: Managed Care, Other (non HMO) | Attending: Emergency Medicine | Admitting: Emergency Medicine

## 2016-07-13 ENCOUNTER — Encounter (HOSPITAL_COMMUNITY): Payer: Self-pay | Admitting: *Deleted

## 2016-07-13 DIAGNOSIS — J45909 Unspecified asthma, uncomplicated: Secondary | ICD-10-CM | POA: Diagnosis not present

## 2016-07-13 DIAGNOSIS — R21 Rash and other nonspecific skin eruption: Secondary | ICD-10-CM | POA: Diagnosis not present

## 2016-07-13 DIAGNOSIS — Z87891 Personal history of nicotine dependence: Secondary | ICD-10-CM | POA: Diagnosis not present

## 2016-07-13 MED ORDER — HYDROXYZINE HCL 25 MG PO TABS: 25.0000 mg | ORAL_TABLET | Freq: Four times a day (QID) | ORAL | 0 refills | Status: AC

## 2016-07-13 MED ORDER — PREDNISONE 10 MG PO TABS: 20.0000 mg | ORAL_TABLET | Freq: Two times a day (BID) | ORAL | 0 refills | Status: DC

## 2016-07-13 NOTE — ED Triage Notes (Signed)
Pt reports generalized rash that started on Monday. Pt reports use of new soap.

## 2016-07-13 NOTE — ED Provider Notes (Signed)
` [facility] DEPT Provider Note   CSN: [id number] Arrival date & time: 07/13/16  1502   By signing my name below, I, Christy Sartorius, attest that this documentation has been prepared under the direction and in the presence of  Kerrie Buffalo, NP. Electronically Signed: Christy Sartorius, ED Scribe. 07/13/16. 4:31 PM.  History   Chief Complaint Chief Complaint  Patient presents with  . Rash   The history is provided by the patient and medical records. No language interpreter was used.    HPI Comments:  Nathan Chaney. is a 25 y.o. male who presents to the Emergency Department complaining of a rash on his bilateral forearms and chest beginning 4 days ago.  He states that after playing [place] in [place] he took a shower and used Axe soap.  He reports he began feeling itchy afterwards and notes the rash is most present in the areas here he used the soap heavily.  No alleviating factors noted.  He denies fever, chills, nausea, vomiting, SOB, wheezing and a rash in his genitourinary region.    Past Medical History:  Diagnosis Date  . Allergy   . Asthma     There are no active problems to display for this patient.   Past Surgical History:  Procedure Laterality Date  . KNEE SURGERY         Home Medications    Prior to Admission medications   Medication Sig Start Date End Date Taking? Authorizing Provider  hydrOXYzine (ATARAX/VISTARIL) 25 MG tablet Take 1 tablet (25 mg total) by mouth every 6 (six) hours. 07/13/16   Rece Zechman Orlene Och, NP  naproxen (NAPROSYN) 375 MG tablet Take 1 tablet (375 mg total) by mouth 2 (two) times daily. 04/25/16   Arthor Captain, PA-C  ondansetron (ZOFRAN ODT) 4 MG disintegrating tablet Take 1 tablet (4 mg total) by mouth every 8 (eight) hours as needed for nausea or vomiting. 05/21/16   Melene Plan, DO  predniSONE (DELTASONE) 10 MG tablet Take 2 tablets (20 mg total) by mouth 2 (two) times daily with a meal. 07/13/16   Ethelean Colla Orlene Och, NP  traMADol (ULTRAM) 50  MG tablet Take 1 tablet (50 mg total) by mouth every 6 (six) hours as needed. 04/25/16   Arthor Captain, PA-C    Family History No family history on file.  Social History Social History  Substance Use Topics  . Smoking status: Former Games developer  . Smokeless tobacco: Not on file  . Alcohol use Yes     Comment: occ     Allergies   Mustard seed   Review of Systems Review of Systems  Constitutional: Negative for chills and fever.  Respiratory: Negative for shortness of breath and wheezing.   Gastrointestinal: Negative for nausea and vomiting.  Skin: Positive for rash.     Physical Exam Updated Vital Signs BP 139/70 (BP Location: Right Arm)   Pulse 73   Temp 98.5 F (36.9 C) (Oral)   Resp 18   SpO2 100%   Physical Exam  Constitutional: He is oriented to person, place, and time. He appears well-developed and well-nourished. No distress.  HENT:  Head: Normocephalic and atraumatic.  Eyes: Conjunctivae are normal.  Cardiovascular: Normal rate.   Pulmonary/Chest: Effort normal.  Neurological: He is alert and oriented to person, place, and time.  Skin: Skin is warm and dry. Rash noted.  Small red raised areas to the right side, left upper side, the right chest, abdomen and bilateral forearms.    Psychiatric:  He has a normal mood and affect.  Nursing note and vitals reviewed.    ED Treatments / Results   DIAGNOSTIC STUDIES:  Oxygen Saturation is 100% on RA, NML by my interpretation.    COORDINATION OF CARE:  4:31 PM Discussed treatment plan with pt at bedside and pt agreed to plan.  Labs (all labs ordered are listed, but only abnormal results are displayed) Labs Reviewed - No data to display  Radiology No results found.  Procedures Procedures (including critical care time)  Medications Ordered in ED Medications - No data to display   Initial Impression / Assessment and Plan / ED Course  I have reviewed the triage vital signs and the nursing  notes.   Clinical Course     Patient with contact dermatitis. Instructed to avoid offending agent and to use unscented soaps, lotions, and detergents. Will treat with prednisone and Atarax.  No signs of secondary infection. Follow up with PCP, Din 2-3 days. Return precautions discussed. Pt is safe for discharge at this time.   Final Clinical Impressions(s) / ED Diagnoses   Final diagnoses:  Rash and nonspecific skin eruption    New Prescriptions Discharge Medication List as of 07/13/2016  4:42 PM    START taking these medications   Details  hydrOXYzine (ATARAX/VISTARIL) 25 MG tablet Take 1 tablet (25 mg total) by mouth every 6 (six) hours., Starting Wed 07/13/2016, Print    predniSONE (DELTASONE) 10 MG tablet Take 2 tablets (20 mg total) by mouth 2 (two) times daily with a meal., Starting Wed 07/13/2016, Print       I personally performed the services described in this documentation, which was scribed in my presence. The recorded information has been reviewed and is accurate.     [address]Hope StewartstownM Frimy Uffelman, [place]NP 07/14/16 [id number]    Raeford RazorStephen Kohut, MD 07/14/16 [phone]

## 2016-07-13 NOTE — Discharge Instructions (Signed)
The medication for itching could make you sleepy. Do not drive while taking it.

## 2016-08-05 ENCOUNTER — Encounter (HOSPITAL_COMMUNITY): Payer: Self-pay | Admitting: Vascular Surgery

## 2016-08-05 ENCOUNTER — Emergency Department (HOSPITAL_COMMUNITY)
Admission: EM | Admit: 2016-08-05 | Discharge: 2016-08-06 | Disposition: A | Discharge status: Home / Self Care | Payer: Managed Care, Other (non HMO) | Attending: Emergency Medicine | Admitting: Emergency Medicine

## 2016-08-05 DIAGNOSIS — W03XXXA Other fall on same level due to collision with another person, initial encounter: Secondary | ICD-10-CM | POA: Diagnosis not present

## 2016-08-05 DIAGNOSIS — Y9361 Activity, american tackle football: Secondary | ICD-10-CM | POA: Diagnosis not present

## 2016-08-05 DIAGNOSIS — J45909 Unspecified asthma, uncomplicated: Secondary | ICD-10-CM | POA: Insufficient documentation

## 2016-08-05 DIAGNOSIS — Y999 Unspecified external cause status: Secondary | ICD-10-CM | POA: Insufficient documentation

## 2016-08-05 DIAGNOSIS — Y929 Unspecified place or not applicable: Secondary | ICD-10-CM | POA: Diagnosis not present

## 2016-08-05 DIAGNOSIS — M25511 Pain in right shoulder: Secondary | ICD-10-CM | POA: Diagnosis not present

## 2016-08-05 DIAGNOSIS — Z87891 Personal history of nicotine dependence: Secondary | ICD-10-CM | POA: Insufficient documentation

## 2016-08-05 NOTE — ED Triage Notes (Addendum)
Pt reports to the ED for eval of right shoulder pain. He reports he was playing football on Tuesday and he got pinned and his right shoulder got dislocated. His shoulder was put back in place by the trainer but now he needs a work note or he will get fired. Pt also reports some right arm soreness as well.

## 2016-08-05 NOTE — ED Provider Notes (Signed)
[facility] Provider Note   CSN: [id number] Arrival date & time: 08/05/16  2219  By signing my name below, I, Rosario Adie, attest that this documentation has been prepared under the direction and in the presence of Felicie Morn, NP. Electronically Signed: Rosario Adie, ED Scribe. 08/05/16. 11:54 PM.  History   Chief Complaint Chief Complaint  Patient presents with  . Needs work note  . Shoulder Pain   The history is provided by the patient. No language interpreter was used.  Shoulder Pain   This is a new problem. The current episode started more than 2 days ago. The problem has been gradually improving. The pain is present in the right shoulder. The quality of the pain is described as aching. The pain is at a severity of 4/10. The pain is moderate. Pertinent negatives include no numbness. Family history is significant for no rheumatoid arthritis and no gout.   HPI Comments: Nathan Chaney. is a 25 y.o. male who presents to the Emergency Department complaining of sudden onset, gradually improving right shoulder pain onset ~3 days ago. Pt reports that he was playing football ~3 days ago when he was tackled to the grassy ground below him, sustaining his pain. He states that during the incident that his shoulder became dislocated on scene; however, one of his trainers was able to relocate the shoulder into his joint. No imaging has been performed since this incident. His notes that his pain is now only moderate which he describes as soreness-like, and he is additionally requesting a work note. His pain is exacerbated to the area with movement of the right arm. No OTC medications or treatments were tried prior to coming into the ED. 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 Denies weakness, numbness, or any other associated symptoms.   Past Medical History:  Diagnosis Date  . Allergy   . Asthma    There are no active problems to display for this  patient.  Past Surgical History:  Procedure Laterality Date  . KNEE SURGERY      Home Medications    Prior to Admission medications   Medication Sig Start Date End Date Taking? Authorizing Provider  hydrOXYzine (ATARAX/VISTARIL) 25 MG tablet Take 1 tablet (25 mg total) by mouth every 6 (six) hours. 07/13/16   Hope Orlene Och, NP  naproxen (NAPROSYN) 375 MG tablet Take 1 tablet (375 mg total) by mouth 2 (two) times daily. 04/25/16   Arthor Captain, PA-C  ondansetron (ZOFRAN ODT) 4 MG disintegrating tablet Take 1 tablet (4 mg total) by mouth every 8 (eight) hours as needed for nausea or vomiting. 05/21/16   Melene Plan, DO  predniSONE (DELTASONE) 10 MG tablet Take 2 tablets (20 mg total) by mouth 2 (two) times daily with a meal. 07/13/16   Hope Orlene Och, NP  traMADol (ULTRAM) 50 MG tablet Take 1 tablet (50 mg total) by mouth every 6 (six) hours as needed. 04/25/16   Arthor Captain, PA-C   Family History No family history on file.  Social History Social History  Substance Use Topics  . Smoking status: Former Games developer  . Smokeless tobacco: Never Used  . Alcohol use Yes     Comment: occ   Allergies   Mustard seed   Review of Systems Review of Systems  Musculoskeletal: Positive for arthralgias (right shoulder).  Neurological: Negative for weakness and numbness.  All other systems reviewed and are negative.  Physical Exam Updated Vital Signs BP  136/83 (BP Location: Right Arm)   Pulse 71   Temp 98.3 F (36.8 C) (Oral)   Resp 18   SpO2 98%   Physical Exam  Constitutional: He appears well-developed and well-nourished.  HENT:  Head: Normocephalic.  Eyes: Conjunctivae are normal.  Cardiovascular: Normal rate.   Pulmonary/Chest: Effort normal. No respiratory distress.  Abdominal: He exhibits no distension.  Musculoskeletal:  Right shoulder pain increased with ROM.   Neurological: He is alert.  Skin: Skin is warm and dry.  Psychiatric: He has a normal mood and affect. His behavior is  normal.  Nursing note and vitals reviewed.  ED Treatments / Results  DIAGNOSTIC STUDIES: Oxygen Saturation is 98% on RA, normal by my interpretation.   COORDINATION OF CARE: 11:54 PM-Discussed next steps with pt. Pt verbalized understanding and is agreeable with the plan.   Radiology Dg Shoulder Right  Result Date: 08/06/2016 CLINICAL DATA:  Recent right shoulder dislocation while playing football, status post reduction. Anterior right shoulder pain. Initial encounter. EXAM: RIGHT SHOULDER - 2+ VIEW COMPARISON:  None. FINDINGS: There is no evidence of fracture or dislocation at this time. The right acromioclavicular joint is grossly unremarkable. The right humeral head remains seated at the glenoid fossa. The visualized portions of the right lung appear grossly clear. No definite soft tissue abnormalities are characterized on radiograph. IMPRESSION: No evidence of fracture or dislocation. Electronically Signed   By: Roanna RaiderJeffery  Chang M.D.   On: 08/06/2016 01:22    Procedures Procedures   Medications Ordered in ED Medications - No data to display  Initial Impression / Assessment and Plan / ED Course  I have reviewed the triage vital signs and the nursing notes.  Pertinent labs & imaging results that were available during my care of the patient were reviewed by me and considered in my medical decision making (see chart for details).  Clinical Course   Patient X-Ray negative for obvious fracture or dislocation. Patient is [facility]. Conservative therapy recommended and discussed. Patient will be dc to follow-up with the team physician & is agreeable with above plan.  Final Clinical Impressions(s) / ED Diagnoses   Final diagnoses:  Acute pain of right shoulder   New Prescriptions New Prescriptions   No medications on file   I personally performed the services described in this documentation, which was scribed in my  presence. The recorded information has been reviewed and is accurate.     Felicie Mornavid Rodrick Payson, NP 08/06/16 [id number]    Gwyneth SproutWhitney Plunkett, MD 08/06/16 2134

## 2016-08-06 ENCOUNTER — Emergency Department (HOSPITAL_COMMUNITY): Payer: Managed Care, Other (non HMO)

## 2016-08-06 NOTE — ED Notes (Signed)
Pt transported to xray 

## 2016-08-06 NOTE — ED Notes (Signed)
Pt verbalized understanding of d/c instructions and has no further questions. 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 Pt stable, ambulatory and NAD.

## 2016-08-06 NOTE — Discharge Instructions (Signed)
Continue with your treatment plan established by the training staff. Be sure to follow-up with the team physician on Tuesday.

## 2017-05-09 IMAGING — CR DG SHOULDER 2+V*R*
2 series · 2 of 2 positions shown · non-contrast
Comparison: None.

CLINICAL DATA: Recent right shoulder dislocation while playing
football, status post reduction. Anterior right shoulder pain.
Initial encounter.

EXAM:
RIGHT SHOULDER - 2+ VIEW

[shoulder grashey]
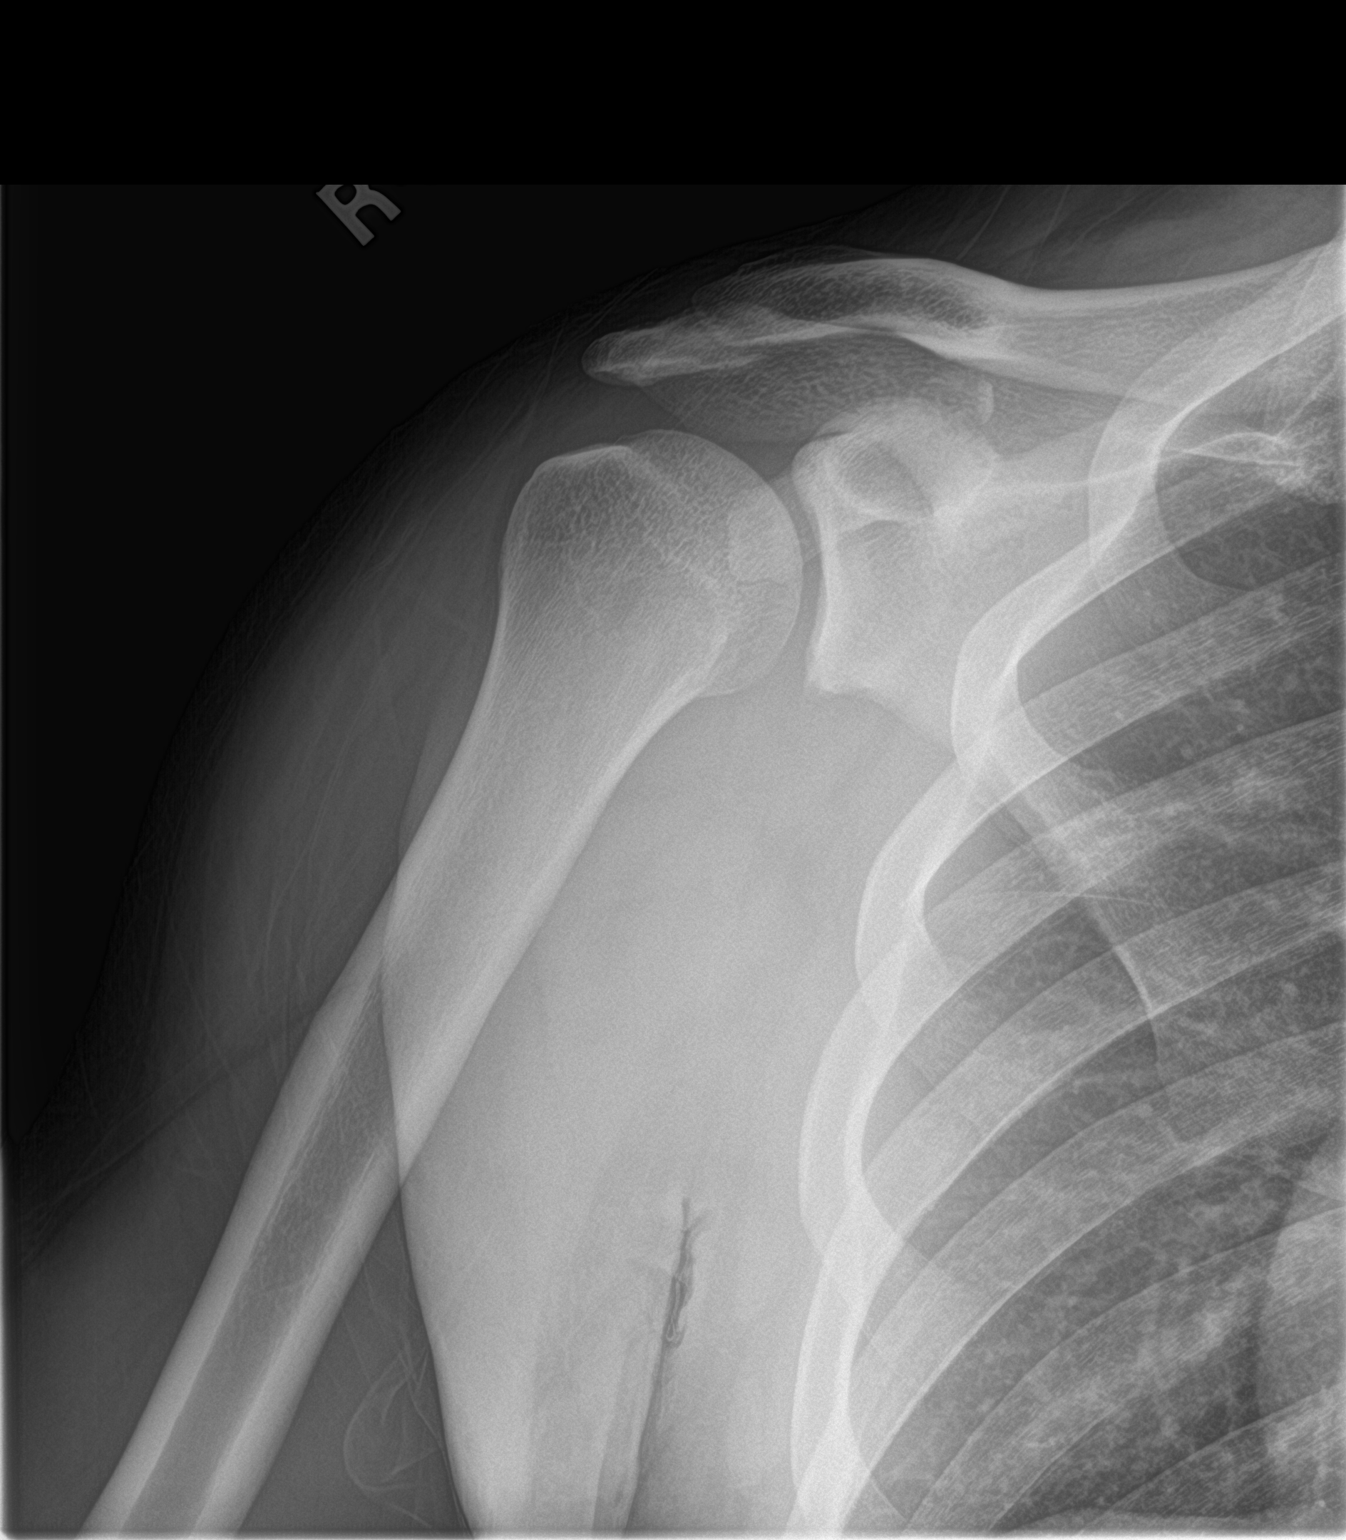

[shoulder y view]
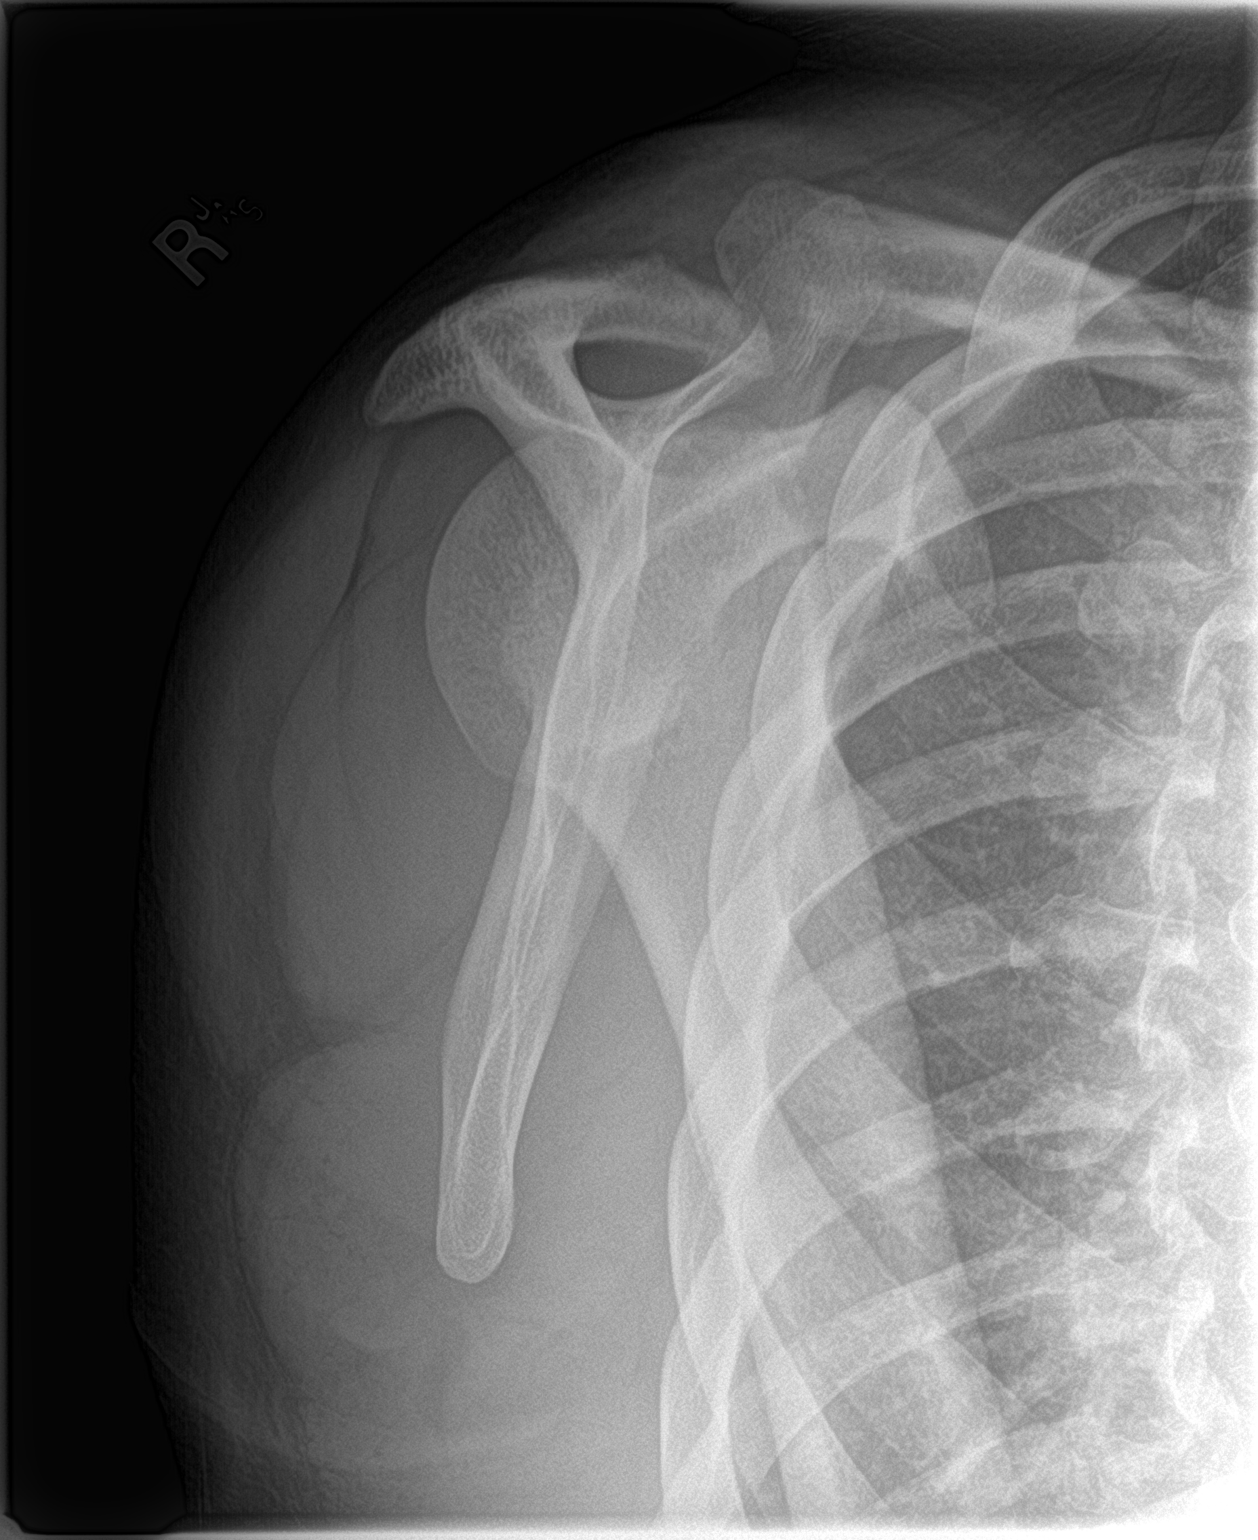

[2 of 2 positions shown; findings below may reference images not displayed]

FINDINGS: There is no evidence of fracture or dislocation at this time. The
right acromioclavicular joint is grossly unremarkable. The right
humeral head remains seated at the glenoid fossa. The visualized
portions of the right lung appear grossly clear. No definite soft
tissue abnormalities are characterized on radiograph.
IMPRESSION: No evidence of fracture or dislocation.

## 2020-08-26 ENCOUNTER — Other Ambulatory Visit: Payer: Self-pay

## 2020-08-26 ENCOUNTER — Ambulatory Visit
Admission: RE | Admit: 2020-08-26 | Discharge: 2020-08-26 | Disposition: A | Discharge status: Home / Self Care | Payer: BC Managed Care – PPO | Source: Ambulatory Visit | Attending: Emergency Medicine | Admitting: Emergency Medicine

## 2020-08-26 VITALS — BP 134/79 | HR 72 | Temp 98.0°F | Resp 18

## 2020-08-26 DIAGNOSIS — S161XXA Strain of muscle, fascia and tendon at neck level, initial encounter: Secondary | ICD-10-CM

## 2020-08-26 MED ORDER — CYCLOBENZAPRINE HCL 5 MG PO TABS: 5.0000 mg | ORAL_TABLET | Freq: Two times a day (BID) | ORAL | 0 refills | Status: AC | PRN

## 2020-08-26 MED ORDER — NAPROXEN 500 MG PO TABS: 500.0000 mg | ORAL_TABLET | Freq: Two times a day (BID) | ORAL | 0 refills | Status: DC

## 2020-08-26 NOTE — Discharge Instructions (Addendum)
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
## 2020-09-04 ENCOUNTER — Other Ambulatory Visit: Payer: Self-pay

## 2020-09-04 ENCOUNTER — Ambulatory Visit
Admission: RE | Admit: 2020-09-04 | Discharge: 2020-09-04 | Disposition: A | Discharge status: Home / Self Care | Payer: BC Managed Care – PPO | Source: Ambulatory Visit

## 2020-09-04 VITALS — BP 126/86 | HR 87 | Temp 98.9°F | Resp 18

## 2020-09-04 DIAGNOSIS — S161XXD Strain of muscle, fascia and tendon at neck level, subsequent encounter: Secondary | ICD-10-CM | POA: Diagnosis not present

## 2020-09-04 NOTE — ED Provider Notes (Signed)
[name]    CSN: [id number] Arrival date & time: 09/04/20  1354      History   Chief Complaint Chief Complaint  Patient presents with  . Back Pain    HPI Nathan Chaney. is a 29 y.o. male  Presenting for persistent back pain.  Patient seen initially for this by me on 10/27: Given supportive care including NSAID, muscle relaxer.  States he took "maybe 4 doses since".  Has not been taking routinely, icing, using heat, or stretching.  Did not follow-up with orthopedics as previously recommended.  Requesting work note.  No traumatization, worsening of condition.  Past Medical History:  Diagnosis Date  . Allergy   . Asthma     There are no problems to display for this patient.   Past Surgical History:  Procedure Laterality Date  . KNEE SURGERY         Home Medications    Prior to Admission medications   Medication Sig Start Date End Date Taking? Authorizing Provider  hydrOXYzine (ATARAX/VISTARIL) 25 MG tablet Take 1 tablet (25 mg total) by mouth every 6 (six) hours. 07/13/16   Janne Napoleon, NP  naproxen (NAPROSYN) 500 MG tablet Take 1 tablet (500 mg total) by mouth 2 (two) times daily. 08/26/20   Hall-Potvin, [place], PA-C    Family History History reviewed. No pertinent family history.  Social History Social History   Tobacco Use  . Smoking status: Former Games developer  . Smokeless tobacco: Never Used  Vaping Use  . Vaping Use: Never used  Substance Use Topics  . Alcohol use: Yes    Comment: occ  . Drug use: No     Allergies   Mustard seed   Review of Systems Review of Systems  Constitutional: Negative for fatigue and fever.  Respiratory: Negative for cough and shortness of breath.   Cardiovascular: Negative for chest pain and palpitations.  Gastrointestinal: Negative for abdominal pain, diarrhea and vomiting.  Musculoskeletal: Positive for back pain. Negative for arthralgias, gait problem, myalgias, neck pain and neck stiffness.   Skin: Negative for rash and wound.  Neurological: Negative for speech difficulty and headaches.  All other systems reviewed and are negative.    Physical Exam Triage Vital Signs ED Triage Vitals  Enc Vitals Group     BP      Pulse      Resp      Temp      Temp src      SpO2      Weight      Height      Head Circumference      Peak Flow      Pain Score      Pain Loc      Pain Edu?      Excl. in GC?    No data found.  Updated Vital Signs BP 126/86 (BP Location: Left Arm)   Pulse 87   Temp 98.9 F (37.2 C) (Oral)   Resp 18   SpO2 96%   Visual Acuity Right Eye Distance:   Left Eye Distance:   Bilateral Distance:    Right Eye Near:   Left Eye Near:    Bilateral Near:     Physical Exam Constitutional:      General: He is not in acute distress. HENT:     Head: Normocephalic and atraumatic.  Eyes:     General: No scleral icterus.    Pupils: Pupils are equal, round, and reactive  to light.  Cardiovascular:     Rate and Rhythm: Normal rate.  Pulmonary:     Effort: Pulmonary effort is normal. No respiratory distress.     Breath sounds: No wheezing.  Musculoskeletal:        General: Tenderness present. No swelling or deformity. Normal range of motion.     Comments: Mild, diffuse thoracic bilateral back tenderness that spares scapula, spinous process.  NVI  Skin:    Coloration: Skin is not jaundiced or pale.     Findings: No bruising or rash.  Neurological:     Mental Status: He is alert and oriented to person, place, and time.      UC Treatments / Results  Labs (all labs ordered are listed, but only abnormal results are displayed) Labs Reviewed - No data to display  EKG   Radiology No results found.  Procedures Procedures (including critical care time)  Medications Ordered in UC Medications - No data to display  Initial Impression / Assessment and Plan / UC Course  I have reviewed the triage vital signs and the nursing notes.  Pertinent  labs & imaging results that were available during my care of the patient were reviewed by me and considered in my medical decision making (see chart for details).     No retraumatization or indication for radiography at this time.  Stressed importance of compliance with prescribed therapies as well as follow-up.  Provided work note, though advised patient would not be able to do so again without seeing orthopedics.  Return precautions discussed, pt verbalized understanding and is agreeable to plan. Final Clinical Impressions(s) / UC Diagnoses   Final diagnoses:  Cervical strain, subsequent encounter   Discharge Instructions   None    ED Prescriptions    None     PDMP not reviewed this encounter.   Hall-Potvin, [place], [place] 09/04/20 1426
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
## 2021-01-04 ENCOUNTER — Ambulatory Visit
Admission: EM | Admit: 2021-01-04 | Discharge: 2021-01-04 | Disposition: A | Discharge status: Home / Self Care | Payer: BC Managed Care – PPO | Attending: Family Medicine | Admitting: Family Medicine

## 2021-01-04 ENCOUNTER — Other Ambulatory Visit: Payer: Self-pay

## 2021-01-04 DIAGNOSIS — R5383 Other fatigue: Secondary | ICD-10-CM

## 2021-01-04 DIAGNOSIS — R0602 Shortness of breath: Secondary | ICD-10-CM

## 2021-01-04 MED ORDER — ALBUTEROL SULFATE HFA 108 (90 BASE) MCG/ACT IN AERS
1.0000 | INHALATION_SPRAY | Freq: Four times a day (QID) | RESPIRATORY_TRACT | 0 refills | Status: DC | PRN
Start: 2021-01-04 — End: 2024-01-23

## 2021-01-04 NOTE — ED Provider Notes (Signed)
[name]    CSN: [id number] Arrival date & time: 01/04/21  1041      History   Chief Complaint Chief Complaint  Patient presents with  . Fatigue    HPI Nathan Chaney. is a 30 y.o. male.   HPI  Fatigue, shortness of breath, body aches x 3 days. Patient had a negative Covid test on 01/01/2021. Patient was also seen at a different urgent care on December 10, 2020 for evaluation of fatigue and difficulty sleeping.  He reports resolution of the body aches continues to have fatigue and intermittent shortness of breath.  He has a history of asthma and allergies.  Denies any wheezing.  Currently does not have an albuterol inhaler however used one as an adolescent.  Denies any coughing or associated URI symptoms.  Past Medical History:  Diagnosis Date  . Allergy   . Asthma     There are no problems to display for this patient.   Past Surgical History:  Procedure Laterality Date  . KNEE SURGERY         Home Medications    Prior to Admission medications   Medication Sig Start Date End Date Taking? Authorizing Provider  hydrOXYzine (ATARAX/VISTARIL) 25 MG tablet Take 1 tablet (25 mg total) by mouth every 6 (six) hours. 07/13/16   Janne Napoleon, NP  naproxen (NAPROSYN) 500 MG tablet Take 1 tablet (500 mg total) by mouth 2 (two) times daily. 08/26/20   Hall-Potvin, [place], PA-C    Family History Family History  Problem Relation Age of Onset  . Healthy Mother   . Healthy Father     Social History Social History   Tobacco Use  . Smoking status: Former Games developer  . Smokeless tobacco: Never Used  Vaping Use  . Vaping Use: Never used  Substance Use Topics  . Alcohol use: Yes    Comment: occ  . Drug use: No     Allergies   Mustard seed   Review of Systems Review of Systems Pertinent negatives listed in HPI  Physical Exam Triage Vital Signs ED Triage Vitals  Enc Vitals Group     BP 01/04/21 1104 117/76     Pulse Rate 01/04/21 1104 80      Resp 01/04/21 1104 19     Temp 01/04/21 1104 98.1 F (36.7 C)     Temp src --      SpO2 01/04/21 1104 96 %     Weight --      Height --      Head Circumference --      Peak Flow --      Pain Score 01/04/21 1102 0     Pain Loc --      Pain Edu? --      Excl. in GC? --    No data found.  Updated Vital Signs BP 117/76   Pulse 80   Temp 98.1 F (36.7 C)   Resp 19   SpO2 96%   Visual Acuity Right Eye Distance:   Left Eye Distance:   Bilateral Distance:    Right Eye Near:   Left Eye Near:    Bilateral Near:     Physical Exam General appearance: alert, well developed, well nourished, cooperative Head: Normocephalic, without obvious abnormality, atraumatic Respiratory: Respirations even and unlabored, normal respiratory rate Heart: rate and rhythm normal. No gallop or murmurs noted on exam  Extremities: No gross deformities Skin: Skin color, texture, turgor normal. No rashes seen  Psych: Appropriate mood and affect. Neurologic: GCS 15, normal gait, normal coordination UC Treatments / Results  Labs (all labs ordered are listed, but only abnormal results are displayed) Labs Reviewed  COVID-19, FLU A+B NAA    EKG   Radiology No results found.  Procedures Procedures (including critical care time)  Medications Ordered in UC Medications - No data to display  Initial Impression / Assessment and Plan / UC Course  I have reviewed the triage vital signs and the nursing notes.  Pertinent labs & imaging results that were available during my care of the patient were reviewed by me and considered in my medical decision making (see chart for details).    COVID-19 test pending patient had a rapid negative test on 01/01/2021. Patient is asymptomatic and may return to work tomorrow.  Patient endorses some shortness of breath however has a normal respiratory evaluation.  Suspect shortness of breath may be environmental allergy induced due to the change in seasons prescribed an  albuterol inhaler for as needed usage. Advised patient to get established with primary care for a full complete physical for further work-up and evaluation as a source of his fatigue and insomnia.  Patient given information to get established with a primary care provider that is currently excepting new patients.  Patient verbalized understanding and agreement with today's plan. Final Clinical Impressions(s) / UC Diagnoses   Final diagnoses:  Fatigue, unspecified type  Shortness of breath     Discharge Instructions     Suspect shortness of breath may be related to outdoor allergens.  I have prescribed an albuterol inhaler to use as needed if shortness of breath symptoms recur.  Also recommend doing an over-the-counter antihistamine due to the change from winter to spring can cause asthma-like symptoms to flareup.  Recommend either cetirizine 10 mg over-the-counter or Claritin 10 mg. For further work-up and evaluation of the source of your fatigue and insomnia follow-up with primary care to get established as a new patient and to have a complete physical exam.    ED Prescriptions    Medication Sig Dispense Auth. Provider   albuterol (VENTOLIN HFA) 108 (90 Base) MCG/ACT inhaler Inhale 1-2 puffs into the lungs every 6 (six) hours as needed for wheezing or shortness of breath. 8 g Bing Neighbors, FNP     PDMP not reviewed this encounter.   Bing Neighbors, FNP 01/04/21 1201

## 2021-01-04 NOTE — ED Triage Notes (Signed)
Pt presents with complaints of body aches, fatigue, and shortness of breath x 3 days. Reports he has been working a lot at work and has 2 jobs. He feels like he has been working too much. Pt reports he had a fever late last week. Reports negative rapid covid test.

## 2021-01-04 NOTE — Discharge Instructions (Signed)
Suspect shortness of breath may be related to outdoor allergens.  I have prescribed an albuterol inhaler to use as needed if shortness of breath symptoms recur.  Also recommend doing an over-the-counter antihistamine due to the change from winter to spring can cause asthma-like symptoms to flareup.  Recommend either cetirizine 10 mg over-the-counter or Claritin 10 mg. For further work-up and evaluation of the source of your fatigue and insomnia follow-up with primary care to get established as a new patient and to have a complete physical exam.

## 2021-01-05 LAB — COVID-19, FLU A+B NAA
Influenza A, NAA: NOT DETECTED
Influenza B, NAA: NOT DETECTED
SARS-CoV-2, NAA: NOT DETECTED

## 2021-01-13 ENCOUNTER — Encounter: Payer: BC Managed Care – PPO | Admitting: Family

## 2021-01-13 NOTE — Progress Notes (Signed)
Patient did not show for appointment.   

## 2021-01-25 ENCOUNTER — Other Ambulatory Visit: Payer: Self-pay

## 2021-01-25 ENCOUNTER — Encounter (HOSPITAL_COMMUNITY): Payer: Self-pay | Admitting: Emergency Medicine

## 2021-01-25 ENCOUNTER — Ambulatory Visit (HOSPITAL_COMMUNITY): Payer: Self-pay

## 2021-01-25 ENCOUNTER — Ambulatory Visit (HOSPITAL_COMMUNITY)
Admission: EM | Admit: 2021-01-25 | Discharge: 2021-01-25 | Disposition: A | Discharge status: Home / Self Care | Payer: BC Managed Care – PPO | Attending: Emergency Medicine | Admitting: Emergency Medicine

## 2021-01-25 DIAGNOSIS — M545 Low back pain, unspecified: Secondary | ICD-10-CM

## 2021-01-25 DIAGNOSIS — M7052 Other bursitis of knee, left knee: Secondary | ICD-10-CM

## 2021-01-25 DIAGNOSIS — M25562 Pain in left knee: Secondary | ICD-10-CM

## 2021-01-25 MED ORDER — NAPROXEN 500 MG PO TABS: 500.0000 mg | ORAL_TABLET | Freq: Two times a day (BID) | ORAL | 0 refills | Status: AC

## 2021-01-25 NOTE — Discharge Instructions (Signed)
Take the naproxen twice a day for 1 week.   Use the RICE Method: RICE: Rest Ice for 10-15 minutes every 4-6 hours as needed for pain and swelling Compression (ace bandage) Elevation   Follow up with orthopedics or sports medicine.    Return or go to the Emergency Department if symptoms worsen or do not improve in the next few days.

## 2021-01-25 NOTE — ED Provider Notes (Signed)
[facility]    CSN: [id number] Arrival date & time: 01/25/21  1847      History   Chief Complaint Chief Complaint  Patient presents with  . Knee Pain    Left    HPI Nathan Derwin. is a 30 y.o. male.   Nathan Coye. is a 30 y.o. here with chief complaint of left knee pain and lower back pain.  Reports having surgery on left knee in the past but injured knee back in Nov/Dec while playing football.  Reports intermittent achy knee pain and swelling.  Denies any issues with ROM.  Denies any locking or instability.  Also reports lower back pain but has been spending a lot of time traveling and sleeping in a recliner while visiting a family member in the hospital.  Denies any numbness or tingling.  Denies any loss of bowel or bladder control.  Denies any specific alleviating or aggravating factors.  Denies any fevers, chest pain, shortness of breath, N/V/D, numbness, tingling, weakness, abdominal pain, or headaches.   ROS: As per HPI, all other pertinent ROS negative      The history is provided by the patient.  Knee Pain Associated symptoms: back pain     Past Medical History:  Diagnosis Date  . Allergy   . Asthma     There are no problems to display for this patient.   Past Surgical History:  Procedure Laterality Date  . KNEE SURGERY         Home Medications    Prior to Admission medications   Medication Sig Start Date End Date Taking? Authorizing Provider  naproxen (NAPROSYN) 500 MG tablet Take 1 tablet (500 mg total) by mouth 2 (two) times daily with a meal for 7 days. 01/25/21 02/01/21 Yes Ivette Loyal, NP  albuterol (VENTOLIN HFA) 108 (90 Base) MCG/ACT inhaler Inhale 1-2 puffs into the lungs every 6 (six) hours as needed for wheezing or shortness of breath. 01/04/21   Bing Neighbors, FNP  hydrOXYzine (ATARAX/VISTARIL) 25 MG tablet Take 1 tablet (25 mg total) by mouth every 6 (six) hours. 07/13/16   Janne Napoleon, NP    Family History Family  History  Problem Relation Age of Onset  . Healthy Mother   . Healthy Father     Social History Social History   Tobacco Use  . Smoking status: Former Games developer  . Smokeless tobacco: Never Used  Vaping Use  . Vaping Use: Never used  Substance Use Topics  . Alcohol use: Yes    Comment: occ  . Drug use: No     Allergies   Mustard seed   Review of Systems Review of Systems  Musculoskeletal: Positive for arthralgias, back pain and joint swelling.     Physical Exam Triage Vital Signs ED Triage Vitals  Enc Vitals Group     BP 01/25/21 1914 (!) 141/93     Pulse Rate 01/25/21 1914 73     Resp 01/25/21 1914 17     Temp 01/25/21 1914 98.6 F (37 C)     Temp Source 01/25/21 1914 Oral     SpO2 01/25/21 1914 97 %     Weight --      Height --      Head Circumference --      Peak Flow --      Pain Score 01/25/21 1908 0     Pain Loc --      Pain Edu? --  Excl. in GC? --    No data found.  Updated Vital Signs BP (!) 141/93 (BP Location: Left Arm)   Pulse 73   Temp 98.6 F (37 C) (Oral)   Resp 17   SpO2 97%   Visual Acuity Right Eye Distance:   Left Eye Distance:   Bilateral Distance:    Right Eye Near:   Left Eye Near:    Bilateral Near:     Physical Exam Vitals and nursing note reviewed.  Constitutional:      General: He is not in acute distress.    Appearance: Normal appearance. He is not ill-appearing, toxic-appearing or diaphoretic.  HENT:     Head: Normocephalic and atraumatic.  Eyes:     Conjunctiva/sclera: Conjunctivae normal.  Cardiovascular:     Rate and Rhythm: Normal rate.     Pulses: Normal pulses.  Pulmonary:     Effort: Pulmonary effort is normal.  Abdominal:     General: Abdomen is flat.  Musculoskeletal:        General: Normal range of motion.     Cervical back: Normal and normal range of motion.     Thoracic back: Normal.     Lumbar back: Normal.     Right knee: Normal.     Left knee: Swelling and effusion present. Normal  range of motion. No tenderness. Normal alignment. Normal pulse.     Instability Tests: Anterior drawer test negative. Posterior drawer test negative. Anterior Lachman test negative. Medial McMurray test negative and lateral McMurray test negative.  Skin:    General: Skin is warm and dry.  Neurological:     General: No focal deficit present.     Mental Status: He is alert and oriented to person, place, and time.  Psychiatric:        Mood and Affect: Mood normal.      UC Treatments / Results  Labs (all labs ordered are listed, but only abnormal results are displayed) Labs Reviewed - No data to display  EKG   Radiology No results found.  Procedures Procedures (including critical care time)  Medications Ordered in UC Medications - No data to display  Initial Impression / Assessment and Plan / UC Course  I have reviewed the triage vital signs and the nursing notes.  Pertinent labs & imaging results that were available during my care of the patient were reviewed by me and considered in my medical decision making (see chart for details).     Bursitis of the left knee Left knee pain Acute lower back pain  Assessment negative for red flags or concerns, including septic joint. Naproxen BID x7 days RICE method, use ace bandage Can take Tylenol as needed for pain Follow up with ortho  Final Clinical Impressions(s) / UC Diagnoses   Final diagnoses:  Bursitis of left knee, unspecified bursa  Left knee pain, unspecified chronicity  Acute bilateral low back pain without sciatica     Discharge Instructions     Take the naproxen twice a day for 1 week.   Use the RICE Method: RICE: Rest Ice for 10-15 minutes every 4-6 hours as needed for pain and swelling Compression (ace bandage) Elevation   Follow up with orthopedics or sports medicine.    Return or go to the Emergency Department if symptoms worsen or do not improve in the next few days.       ED Prescriptions     Medication Sig Dispense Auth. Provider   naproxen (NAPROSYN) 500 MG  tablet Take 1 tablet (500 mg total) by mouth 2 (two) times daily with a meal for 7 days. 30 tablet Ivette Loyal, NP     PDMP not reviewed this encounter.   Ivette Loyal, NP 01/25/21 [phone]

## 2021-01-25 NOTE — ED Triage Notes (Signed)
Pt presents with right knee swelling. States has hx of knee surgery in same knee.   Pt also c/o low back pain. States has been traveling and having to sleep in hospital chair while staying with family member in the hospital.

## 2021-01-28 ENCOUNTER — Encounter (HOSPITAL_COMMUNITY): Payer: Self-pay

## 2021-01-28 ENCOUNTER — Ambulatory Visit (HOSPITAL_COMMUNITY)
Admission: RE | Admit: 2021-01-28 | Discharge: 2021-01-28 | Disposition: A | Discharge status: Home / Self Care | Payer: BC Managed Care – PPO | Source: Ambulatory Visit

## 2021-01-28 ENCOUNTER — Other Ambulatory Visit: Payer: Self-pay

## 2021-01-28 VITALS — BP 124/72 | HR 91 | Temp 98.0°F | Resp 16

## 2021-01-28 DIAGNOSIS — M25562 Pain in left knee: Secondary | ICD-10-CM | POA: Diagnosis not present

## 2021-01-28 NOTE — ED Triage Notes (Signed)
Patient presents to Urgent Care with complaints of knee pain. He was seen 03/28 for same issue. He is here for follow-up and possible work note.

## 2021-01-28 NOTE — ED Provider Notes (Signed)
[facility]    CSN: [id number] Arrival date & time: 01/28/21  1412      History   Chief Complaint Chief Complaint  Patient presents with  . Knee Pain    HPI Nathan Chaney. is a 30 y.o. male.   Patient presents with acute knee pain beginning Nov/Dec 2021 after injury while playing football. Has been painful and swelling intermittently since. Numbness and tingling around lower medial knee. ROM intact. Denies locking or buckiling of knee. Seen on 3/28 in Urgent care. Using ace bandage and naproxen for management with some improvement seen. Requesting doctor's note until orthopedic appointment. Has appointment with murphy wainer 4/1.     Past Medical History:  Diagnosis Date  . Allergy   . Asthma     There are no problems to display for this patient.   Past Surgical History:  Procedure Laterality Date  . KNEE SURGERY         Home Medications    Prior to Admission medications   Medication Sig Start Date End Date Taking? Authorizing Provider  albuterol (VENTOLIN HFA) 108 (90 Base) MCG/ACT inhaler Inhale 1-2 puffs into the lungs every 6 (six) hours as needed for wheezing or shortness of breath. 01/04/21   Bing Neighbors, FNP  hydrOXYzine (ATARAX/VISTARIL) 25 MG tablet Take 1 tablet (25 mg total) by mouth every 6 (six) hours. 07/13/16   Janne Napoleon, NP  naproxen (NAPROSYN) 500 MG tablet Take 1 tablet (500 mg total) by mouth 2 (two) times daily with a meal for 7 days. 01/25/21 02/01/21  Ivette Loyal, NP    Family History Family History  Problem Relation Age of Onset  . Healthy Mother   . Healthy Father     Social History Social History   Tobacco Use  . Smoking status: Former Games developer  . Smokeless tobacco: Never Used  Vaping Use  . Vaping Use: Never used  Substance Use Topics  . Alcohol use: Yes    Comment: occ  . Drug use: No     Allergies   Mustard seed   Review of Systems Review of Systems  Constitutional: Negative.   Respiratory:  Negative.   Cardiovascular: Negative.   Musculoskeletal: Positive for arthralgias. Negative for back pain, gait problem, joint swelling, myalgias, neck pain and neck stiffness.  Skin: Negative.   Neurological: Positive for numbness. Negative for dizziness, tremors, seizures, syncope, facial asymmetry, speech difficulty, weakness, light-headedness and headaches.     Physical Exam Triage Vital Signs ED Triage Vitals [01/28/21 1425]  Enc Vitals Group     BP 124/72     Pulse Rate 91     Resp 16     Temp 98 F (36.7 C)     Temp Source Oral     SpO2 98 %     Weight      Height      Head Circumference      Peak Flow      Pain Score 2     Pain Loc      Pain Edu?      Excl. in GC?    No data found.  Updated Vital Signs BP 124/72 (BP Location: Left Arm)   Pulse 91   Temp 98 F (36.7 C) (Oral)   Resp 16   SpO2 98%   Visual Acuity Right Eye Distance:   Left Eye Distance:   Bilateral Distance:    Right Eye Near:   Left Eye Near:  Bilateral Near:     Physical Exam Constitutional:      Appearance: Normal appearance. He is normal weight.  HENT:     Head: Normocephalic.  Eyes:     Extraocular Movements: Extraocular movements intact.  Pulmonary:     Effort: Pulmonary effort is normal.  Musculoskeletal:     Left knee: Swelling present. No bony tenderness or crepitus. Tenderness present over the MCL. No MCL laxity.      Legs:  Skin:    General: Skin is warm and dry.  Neurological:     General: No focal deficit present.     Mental Status: He is alert and oriented to person, place, and time. Mental status is at baseline.  Psychiatric:        Mood and Affect: Mood normal.        Behavior: Behavior normal.        Thought Content: Thought content normal.        Judgment: Judgment normal.      UC Treatments / Results  Labs (all labs ordered are listed, but only abnormal results are displayed) Labs Reviewed - No data to display  EKG   Radiology No results  found.  Procedures Procedures (including critical care time)  Medications Ordered in UC Medications - No data to display  Initial Impression / Assessment and Plan / UC Course  I have reviewed the triage vital signs and the nursing notes.  Pertinent labs & imaging results that were available during my care of the patient were reviewed by me and considered in my medical decision making (see chart for details).  Acute left knee pain  1. Continue use of naprxen bid 2. Continue use of ace bandage for additional support 3, RICE method 4. Keep appointment with orthopedic specialist for evaluation  Final Clinical Impressions(s) / UC Diagnoses   Final diagnoses:  Acute pain of left knee     Discharge Instructions     Continue use of naproxen twice daily  Can continue use of ace bandage for support   Continue RICE treatment, rest, ice, compression and elevation of leg    ED Prescriptions    None     PDMP not reviewed this encounter.   Valinda Hoar, NP 01/28/21 1500

## 2021-01-28 NOTE — Discharge Instructions (Addendum)
Continue use of naproxen twice daily  Can continue use of ace bandage for support   Continue RICE treatment, rest, ice, compression and elevation of leg

## 2021-02-18 ENCOUNTER — Ambulatory Visit: Payer: Self-pay

## 2021-02-22 ENCOUNTER — Ambulatory Visit
Admission: RE | Admit: 2021-02-22 | Discharge: 2021-02-22 | Disposition: A | Discharge status: Home / Self Care | Payer: BC Managed Care – PPO | Source: Ambulatory Visit | Attending: Emergency Medicine | Admitting: Emergency Medicine

## 2021-02-22 ENCOUNTER — Other Ambulatory Visit: Payer: Self-pay

## 2021-02-22 VITALS — BP 130/85 | HR 63 | Temp 98.3°F | Resp 17

## 2021-02-22 DIAGNOSIS — M25562 Pain in left knee: Secondary | ICD-10-CM

## 2021-02-22 MED ORDER — NAPROXEN 500 MG PO TABS: 500.0000 mg | ORAL_TABLET | Freq: Two times a day (BID) | ORAL | 0 refills | Status: AC

## 2021-02-22 NOTE — ED Provider Notes (Signed)
[name]    CSN: [id number] Arrival date & time: 02/22/21  1408      History   Chief Complaint Chief Complaint  Patient presents with  . Knee Pain    Left    HPI Nathan Chaney. is a 30 y.o. male history of asthma presenting today for evaluation of knee pain.  Reports over the past 3 to 4 days has had increased pain and swelling to his left knee.  He has history of prior MPFL reconstruction in 2016.  Since occasion will have flares of pain and swelling.  Reports tweaked his knee without any sensations of popping tearing or catching.  History of similar recently approximately 1 month ago and improved with naproxen.  Requesting refill of this.  HPI  Past Medical History:  Diagnosis Date  . Allergy   . Asthma     There are no problems to display for this patient.   Past Surgical History:  Procedure Laterality Date  . KNEE SURGERY         Home Medications    Prior to Admission medications   Medication Sig Start Date End Date Taking? Authorizing Provider  naproxen (NAPROSYN) 500 MG tablet Take 1 tablet (500 mg total) by mouth 2 (two) times daily. 02/22/21  Yes Keeara Frees C, PA-C  albuterol (VENTOLIN HFA) 108 (90 Base) MCG/ACT inhaler Inhale 1-2 puffs into the lungs every 6 (six) hours as needed for wheezing or shortness of breath. 01/04/21   Bing Neighbors, FNP  hydrOXYzine (ATARAX/VISTARIL) 25 MG tablet Take 1 tablet (25 mg total) by mouth every 6 (six) hours. 07/13/16   Janne Napoleon, NP    Family History Family History  Problem Relation Age of Onset  . Healthy Mother   . Healthy Father     Social History Social History   Tobacco Use  . Smoking status: Former Games developer  . Smokeless tobacco: Never Used  Vaping Use  . Vaping Use: Never used  Substance Use Topics  . Alcohol use: Yes    Comment: occ  . Drug use: No     Allergies   Mustard seed   Review of Systems Review of Systems  Constitutional: Negative for fatigue and  fever.  Eyes: Negative for redness, itching and visual disturbance.  Respiratory: Negative for shortness of breath.   Cardiovascular: Negative for chest pain and leg swelling.  Gastrointestinal: Negative for nausea and vomiting.  Musculoskeletal: Positive for arthralgias and joint swelling. Negative for myalgias.  Skin: Negative for color change, rash and wound.  Neurological: Negative for dizziness, syncope, weakness, light-headedness and headaches.     Physical Exam Triage Vital Signs ED Triage Vitals  Enc Vitals Group     BP 02/22/21 1456 130/85     Pulse Rate 02/22/21 1456 63     Resp 02/22/21 1456 17     Temp 02/22/21 1456 98.3 F (36.8 C)     Temp Source 02/22/21 1456 Oral     SpO2 02/22/21 1456 96 %     Weight --      Height --      Head Circumference --      Peak Flow --      Pain Score 02/22/21 1454 5     Pain Loc --      Pain Edu? --      Excl. in GC? --    No data found.  Updated Vital Signs BP 130/85 (BP Location: Left Arm)   Pulse  63   Temp 98.3 F (36.8 C) (Oral)   Resp 17   SpO2 96%   Visual Acuity Right Eye Distance:   Left Eye Distance:   Bilateral Distance:    Right Eye Near:   Left Eye Near:    Bilateral Near:     Physical Exam Vitals and nursing note reviewed.  Constitutional:      Appearance: He is well-developed.     Comments: No acute distress  HENT:     Head: Normocephalic and atraumatic.     Nose: Nose normal.  Eyes:     Conjunctiva/sclera: Conjunctivae normal.  Cardiovascular:     Rate and Rhythm: Normal rate.  Pulmonary:     Effort: Pulmonary effort is normal. No respiratory distress.  Abdominal:     General: There is no distension.  Musculoskeletal:        General: Normal range of motion.     Cervical back: Neck supple.  Skin:    General: Skin is warm and dry.     Comments: Left knee: Moderate swelling compared to right, tenderness to palpation over infrapatellar area extending slightly medially, full active range of  motion, no laxity appreciated  Neurological:     Mental Status: He is alert and oriented to person, place, and time.      UC Treatments / Results  Labs (all labs ordered are listed, but only abnormal results are displayed) Labs Reviewed - No data to display  EKG   Radiology No results found.  Procedures Procedures (including critical care time)  Medications Ordered in UC Medications - No data to display  Initial Impression / Assessment and Plan / UC Course  I have reviewed the triage vital signs and the nursing notes.  Pertinent labs & imaging results that were available during my care of the patient were reviewed by me and considered in my medical decision making (see chart for details).     Left knee sprain-naproxen prescribed, no mechanism of injury suggestive of acute bony abnormality, continue symptomatic and supportive care rest ice elevation and compression, wear knee brace.  Follow-up with sports medicine if continuing to have recurrent symptoms.  Discussed strict return precautions. Patient verbalized understanding and is agreeable with plan.  Final Clinical Impressions(s) / UC Diagnoses   Final diagnoses:  Acute pain of left knee     Discharge Instructions     Naproxen twice daily with food Ice and elevate Follow up with sports medicine if needed    ED Prescriptions    Medication Sig Dispense Auth. Provider   naproxen (NAPROSYN) 500 MG tablet Take 1 tablet (500 mg total) by mouth 2 (two) times daily. 30 tablet Brysen Shankman, Newell C, PA-C     PDMP not reviewed this encounter.   Lew Dawes, [place] 02/22/21 [phone]

## 2021-02-22 NOTE — ED Triage Notes (Signed)
Pt presents with reoccurring left knee pain. States bending makes pain the worst. Had MRI approx 1 week ago.

## 2021-02-22 NOTE — Discharge Instructions (Signed)
Naproxen twice daily with food Ice and elevate Follow up with sports medicine if needed

## 2022-01-26 ENCOUNTER — Ambulatory Visit
Admission: EM | Admit: 2022-01-26 | Discharge: 2022-01-26 | Disposition: A | Discharge status: Home / Self Care | Payer: BC Managed Care – PPO | Attending: Internal Medicine | Admitting: Internal Medicine

## 2022-01-26 ENCOUNTER — Other Ambulatory Visit: Payer: Self-pay

## 2022-01-26 DIAGNOSIS — M546 Pain in thoracic spine: Secondary | ICD-10-CM | POA: Diagnosis not present

## 2022-01-26 DIAGNOSIS — M25512 Pain in left shoulder: Secondary | ICD-10-CM

## 2022-01-26 MED ORDER — CYCLOBENZAPRINE HCL 5 MG PO TABS: 5.0000 mg | ORAL_TABLET | Freq: Two times a day (BID) | ORAL | 0 refills | Status: AC | PRN

## 2022-01-26 MED ORDER — IBUPROFEN 600 MG PO TABS: 600.0000 mg | ORAL_TABLET | Freq: Four times a day (QID) | ORAL | 0 refills | Status: AC | PRN

## 2022-01-26 NOTE — ED Triage Notes (Signed)
Pt c/o back pain, shoulder pain, dehydration and exhaustion. Shoulder onset ~ 3/23. States back pain has been happening for a while but since here for shoulder wants back checked. 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  ?

## 2022-01-26 NOTE — ED Provider Notes (Signed)
?[name] ? ? ? ?CSN: [id number] ?Arrival date & time: 01/26/22  1747 ? ? ?  ? ?History   ?Chief Complaint ?Chief Complaint  ?Patient presents with  ? Back Pain  ?  As well as shoulder pain, and dehydration. - Entered by patient  ? Shoulder Injury  ? ? ?HPI ?Nathan SchickJames E Scavone Jr. is a 31 y.o. male.  ? ?Patient presents with left shoulder pain and left mid back pain that started a few days ago.  Left shoulder pain started 3/23 and back pain started today.  Denies any apparent injury but patient reports that he does a lot of heavy lifting at work.  Patient does report history of shoulder dislocation and previous football injuries to left shoulder but no obvious chronic back pain.  Pain is present across mid back.  Shoulder pain is present to posterior shoulder.  Patient is able to lift shoulder but pain occurs with movement.  Denies any numbness or tingling.  Patient has not taken any medications to help alleviate symptoms. ? ? ?Back Pain ?Shoulder Injury ? ? ?Past Medical History:  ?Diagnosis Date  ? Allergy   ? Asthma   ? ? ?There are no problems to display for this patient. ? ? ?Past Surgical History:  ?Procedure Laterality Date  ? KNEE SURGERY    ? ? ? ? ? ?Home Medications   ? ?Prior to Admission medications   ?Medication Sig Start Date End Date Taking? Authorizing Provider  ?cyclobenzaprine (FLEXERIL) 5 MG tablet Take 1 tablet (5 mg total) by mouth 2 (two) times daily as needed for muscle spasms. 01/26/22  Yes Tayanna Talford, Acie FredricksonHaley E, FNP  ?ibuprofen (ADVIL) 600 MG tablet Take 1 tablet (600 mg total) by mouth every 6 (six) hours as needed for mild pain. 01/26/22  Yes Jouri Threat, Acie FredricksonHaley E, FNP  ?albuterol (VENTOLIN HFA) 108 (90 Base) MCG/ACT inhaler Inhale 1-2 puffs into the lungs every 6 (six) hours as needed for wheezing or shortness of breath. 01/04/21   Bing NeighborsHarris, Kimberly S, FNP  ?hydrOXYzine (ATARAX/VISTARIL) 25 MG tablet Take 1 tablet (25 mg total) by mouth every 6 (six) hours. 07/13/16   Janne NapoleonNeese, Hope M, NP  ?naproxen  (NAPROSYN) 500 MG tablet Take 1 tablet (500 mg total) by mouth 2 (two) times daily. 02/22/21   Wieters, Hallie C, PA-C  ? ? ?Family History ?Family History  ?Problem Relation Age of Onset  ? Healthy Mother   ? Healthy Father   ? ? ?Social History ?Social History  ? ?Tobacco Use  ? Smoking status: Former  ? Smokeless tobacco: Never  ?Vaping Use  ? Vaping Use: Never used  ?Substance Use Topics  ? Alcohol use: Yes  ?  Comment: occ  ? Drug use: No  ? ? ? ?Allergies   ?Mustard seed ? ? ?Review of Systems ?Review of Systems ?Per HPI ? ?Physical Exam ?Triage Vital Signs ?ED Triage Vitals  ?Enc Vitals Group  ?   BP 01/26/22 1904 129/84  ?   Pulse Rate 01/26/22 1904 79  ?   Resp 01/26/22 1904 18  ?   Temp 01/26/22 1904 98.1 ?F (36.7 ?C)  ?   Temp Source 01/26/22 1904 Oral  ?   SpO2 01/26/22 1904 97 %  ?   Weight --   ?   Height --   ?   Head Circumference --   ?   Peak Flow --   ?   Pain Score 01/26/22 1905 0  ?  Pain Loc --   ?   Pain Edu? --   ?   Excl. in GC? --   ? ?No data found. ? ?Updated Vital Signs ?BP 129/84 (BP Location: Left Arm)   Pulse 79   Temp 98.1 ?F (36.7 ?C) (Oral)   Resp 18   SpO2 97%  ? ?Visual Acuity ?Right Eye Distance:   ?Left Eye Distance:   ?Bilateral Distance:   ? ?Right Eye Near:   ?Left Eye Near:    ?Bilateral Near:    ? ?Physical Exam ?Constitutional:   ?   General: He is not in acute distress. ?   Appearance: Normal appearance. He is not toxic-appearing or diaphoretic.  ?HENT:  ?   Head: Normocephalic and atraumatic.  ?Eyes:  ?   Extraocular Movements: Extraocular movements intact.  ?   Conjunctiva/sclera: Conjunctivae normal.  ?Pulmonary:  ?   Effort: Pulmonary effort is normal.  ?Musculoskeletal:  ?   Cervical back: Normal.  ?   Lumbar back: Normal.  ?     Back: ? ?   Comments: Tenderness to palpation across thoracic back.  No crepitus or step-off.  No obvious swelling or discoloration noted. ? ?Generalized tenderness to palpation throughout left shoulder.  Majority of tenderness is to  left posterior shoulder and trapezius muscle.  Pain with range of motion when arm is abducted at 90 degrees.  No obvious discoloration or swelling noted.  ?Neurological:  ?   General: No focal deficit present.  ?   Mental Status: He is alert and oriented to person, place, and time. Mental status is at baseline.  ?Psychiatric:     ?   Mood and Affect: Mood normal.     ?   Behavior: Behavior normal.     ?   Thought Content: Thought content normal.     ?   Judgment: Judgment normal.  ? ? ? ?UC Treatments / Results  ?Labs ?(all labs ordered are listed, but only abnormal results are displayed) ?Labs Reviewed - No data to display ? ?EKG ? ? ?Radiology ?No results found. ? ?Procedures ?Procedures (including critical care time) ? ?Medications Ordered in UC ?Medications - No data to display ? ?Initial Impression / Assessment and Plan / UC Course  ?I have reviewed the triage vital signs and the nursing notes. ? ?Pertinent labs & imaging results that were available during my care of the patient were reviewed by me and considered in my medical decision making (see chart for details). ? ?  ?Do not think that imaging is necessary given no obvious injury and due to suspicion of muscular injury/strain.  Will prescribe muscle relaxer and NSAID.  Advised patient that muscle relaxer can cause drowsiness.  Patient already has established orthopedist so advised to follow-up if symptoms persist or worsen.  Patient to alternate ice and heat application to affected area as well.  Discussed return precautions.  Patient verbalized understanding and was agreeable with plan. ?Final Clinical Impressions(s) / UC Diagnoses  ? ?Final diagnoses:  ?Acute pain of left shoulder  ?Acute bilateral thoracic back pain  ? ? ? ?Discharge Instructions   ? ?  ?It appears that you have a muscle strain.  You have been prescribed a muscle relaxer and ibuprofen.  Please be advised that muscle relaxer can cause drowsiness.  Do not drive while taking this  medication.  Alternate ice and heat application to affected area as well. ? ? ? ?ED Prescriptions   ? ? Medication Sig Dispense  Auth. Provider  ? cyclobenzaprine (FLEXERIL) 5 MG tablet Take 1 tablet (5 mg total) by mouth 2 (two) times daily as needed for muscle spasms. 20 tablet [place], [place] E, [place]  ? ibuprofen (ADVIL) 600 MG tablet Take 1 tablet (600 mg total) by mouth every 6 (six) hours as needed for mild pain. 30 tablet Ervin Knack E, [place]  ? ?  ? ?PDMP not reviewed this encounter. ?  ?Gustavus Bryant, [place] ?01/26/22 1932 ? ?

## 2022-01-26 NOTE — Discharge Instructions (Signed)
It appears that you have a muscle strain.  You have been prescribed a muscle relaxer and ibuprofen.  Please be advised that muscle relaxer can cause drowsiness.  Do not drive while taking this medication.  Alternate ice and heat application to affected area as well. ?

## 2022-01-31 ENCOUNTER — Ambulatory Visit
Admission: EM | Admit: 2022-01-31 | Discharge: 2022-01-31 | Disposition: A | Discharge status: Home / Self Care | Payer: BC Managed Care – PPO

## 2022-01-31 DIAGNOSIS — M546 Pain in thoracic spine: Secondary | ICD-10-CM | POA: Diagnosis not present

## 2022-01-31 NOTE — Discharge Instructions (Signed)
Follow-up with orthopedist for further evaluation and management.  Continue ibuprofen and muscle relaxer as needed.  Continue alternating ice and heat application. ?

## 2022-01-31 NOTE — ED Triage Notes (Signed)
Patient presents to Urgent Care for follow-up from 03/29. He continues to have back pain and has been treating with muscle relaxant. He states need work note to continue to be on light duty.  ?

## 2022-01-31 NOTE — ED Provider Notes (Signed)
?[name] ? ? ? ?CSN: [id number] ?Arrival date & time: 01/31/22  1743 ? ? ?  ? ?History   ?Chief Complaint ?Chief Complaint  ?Patient presents with  ? Back Pain  ? ? ?HPI ?Nathan Chaney. is a 31 y.o. male.  ? ?Patient presents due to persistent back pain after being seen on 01/26/2022.  Patient reports that he simply wants a work note so he does not make his back any worse.  Back pain has improved with ibuprofen and Flexeril but patient reports that he only takes it as needed and not consistently.  Patient's pain has not become more severe.  No change in pain. ? ? ?Back Pain ? ?Past Medical History:  ?Diagnosis Date  ? Allergy   ? Asthma   ? ? ?There are no problems to display for this patient. ? ? ?Past Surgical History:  ?Procedure Laterality Date  ? KNEE SURGERY    ? ? ? ? ? ?Home Medications   ? ?Prior to Admission medications   ?Medication Sig Start Date End Date Taking? Authorizing Provider  ?albuterol (VENTOLIN HFA) 108 (90 Base) MCG/ACT inhaler Inhale 1-2 puffs into the lungs every 6 (six) hours as needed for wheezing or shortness of breath. 01/04/21   Bing Neighbors, FNP  ?cyclobenzaprine (FLEXERIL) 5 MG tablet Take 1 tablet (5 mg total) by mouth 2 (two) times daily as needed for muscle spasms. 01/26/22   Gustavus Bryant, FNP  ?hydrOXYzine (ATARAX/VISTARIL) 25 MG tablet Take 1 tablet (25 mg total) by mouth every 6 (six) hours. 07/13/16   Janne Napoleon, NP  ?ibuprofen (ADVIL) 600 MG tablet Take 1 tablet (600 mg total) by mouth every 6 (six) hours as needed for mild pain. 01/26/22   Gustavus Bryant, FNP  ?naproxen (NAPROSYN) 500 MG tablet Take 1 tablet (500 mg total) by mouth 2 (two) times daily. 02/22/21   Wieters, Hallie C, PA-C  ? ? ?Family History ?Family History  ?Problem Relation Age of Onset  ? Healthy Mother   ? Healthy Father   ? ? ?Social History ?Social History  ? ?Tobacco Use  ? Smoking status: Former  ? Smokeless tobacco: Never  ?Vaping Use  ? Vaping Use: Never used  ?Substance Use  Topics  ? Alcohol use: Yes  ?  Comment: occ  ? Drug use: No  ? ? ? ?Allergies   ?Mustard seed ? ? ?Review of Systems ?Review of Systems ?Per HPI ? ?Physical Exam ?Triage Vital Signs ?ED Triage Vitals  ?Enc Vitals Group  ?   BP 01/31/22 1855 122/78  ?   Pulse Rate 01/31/22 1855 80  ?   Resp 01/31/22 1855 20  ?   Temp 01/31/22 1855 98.4 ?F (36.9 ?C)  ?   Temp Source 01/31/22 1855 Oral  ?   SpO2 01/31/22 1855 98 %  ?   Weight --   ?   Height --   ?   Head Circumference --   ?   Peak Flow --   ?   Pain Score 01/31/22 1902 3  ?   Pain Loc --   ?   Pain Edu? --   ?   Excl. in GC? --   ? ?No data found. ? ?Updated Vital Signs ?BP (!) 135/92 (BP Location: Right Arm)   Pulse 85   Temp 97.9 ?F (36.6 ?C) (Oral)   Resp 20   SpO2 97%  ? ?Visual Acuity ?Right Eye Distance:   ?  Left Eye Distance:   ?Bilateral Distance:   ? ?Right Eye Near:   ?Left Eye Near:    ?Bilateral Near:    ? ?Physical Exam ?Constitutional:   ?   General: He is not in acute distress. ?   Appearance: Normal appearance. He is not toxic-appearing or diaphoretic.  ?HENT:  ?   Head: Normocephalic and atraumatic.  ?Eyes:  ?   Extraocular Movements: Extraocular movements intact.  ?   Conjunctiva/sclera: Conjunctivae normal.  ?Pulmonary:  ?   Effort: Pulmonary effort is normal.  ?Musculoskeletal:  ?   Cervical back: Normal.  ?   Thoracic back: Tenderness present. No swelling or edema.  ?   Lumbar back: Normal.  ?   Comments: Tenderness to palpation across mid bilateral thoracic area.  No direct spinal tenderness, crepitus, step-off.  No change to pain.  ?Neurological:  ?   General: No focal deficit present.  ?   Mental Status: He is alert and oriented to person, place, and time. Mental status is at baseline.  ?Psychiatric:     ?   Mood and Affect: Mood normal.     ?   Behavior: Behavior normal.     ?   Thought Content: Thought content normal.     ?   Judgment: Judgment normal.  ? ? ? ?UC Treatments / Results  ?Labs ?(all labs ordered are listed, but only abnormal  results are displayed) ?Labs Reviewed - No data to display ? ?EKG ? ? ?Radiology ?No results found. ? ?Procedures ?Procedures (including critical care time) ? ?Medications Ordered in UC ?Medications - No data to display ? ?Initial Impression / Assessment and Plan / UC Course  ?I have reviewed the triage vital signs and the nursing notes. ? ?Pertinent labs & imaging results that were available during my care of the patient were reviewed by me and considered in my medical decision making (see chart for details). ? ?  ? ?Patient's pain has not changed in any way.  He states that he simply wants a work note so that his back pain does not worsen.  Patient to continue ibuprofen and Flexeril as needed.  Patient to continue ice and heat application.  Patient has appointment with orthopedist in approximately 8 days for further evaluation and management.  He was encouraged to follow-up with them.  Discussed return precautions.  Patient verbalized understanding and was agreeable with plan. ?Final Clinical Impressions(s) / UC Diagnoses  ? ?Final diagnoses:  ?Acute bilateral thoracic back pain  ? ? ? ?Discharge Instructions   ? ?  ?Follow-up with orthopedist for further evaluation and management.  Continue ibuprofen and muscle relaxer as needed.  Continue alternating ice and heat application. ? ? ? ?ED Prescriptions   ?None ?  ? ?PDMP not reviewed this encounter. ?  ?Gustavus Bryant, [place] ?01/31/22 1952 ? ?

## 2022-02-03 ENCOUNTER — Ambulatory Visit (HOSPITAL_COMMUNITY)
Admission: EM | Admit: 2022-02-03 | Discharge: 2022-02-03 | Disposition: A | Discharge status: Home / Self Care | Payer: BC Managed Care – PPO | Attending: Internal Medicine | Admitting: Internal Medicine

## 2022-02-03 ENCOUNTER — Encounter (HOSPITAL_COMMUNITY): Payer: Self-pay | Admitting: Emergency Medicine

## 2022-02-03 DIAGNOSIS — M25512 Pain in left shoulder: Secondary | ICD-10-CM

## 2022-02-03 DIAGNOSIS — G8929 Other chronic pain: Secondary | ICD-10-CM

## 2022-02-03 DIAGNOSIS — M549 Dorsalgia, unspecified: Secondary | ICD-10-CM | POA: Diagnosis not present

## 2022-02-03 NOTE — Discharge Instructions (Signed)
Please follow-up with Nathan Chaney as scheduled. ?

## 2022-02-03 NOTE — ED Triage Notes (Signed)
Pt is present today with left shoulder pain and back pain. Pt states that his pain started a few weeks ago.  ?

## 2022-02-04 NOTE — ED Provider Notes (Signed)
?[facility] ? ? ? ?CSN: [id number] ?Arrival date & time: 02/03/22  1906 ? ? ?  ? ?History   ?Chief Complaint ?Chief Complaint  ?Patient presents with  ? Shoulder Pain  ? Back Pain  ? ? ?HPI ?Nathan Chaney. is a 31 y.o. male comes to urgent care with left shoulder pain and mid back pain.  Patient's symptoms are chronic and currently managed with NSAIDs and muscle relaxants.  Patient is here requesting a work excuse.  He has been given limiting the use 2 consecutive times giving him a total of 6 days from work.  He is scheduled to see orthopedic surgery  next Monday.  No change in his pain level.  No new trauma. ?HPI ? ?Past Medical History:  ?Diagnosis Date  ? Allergy   ? Asthma   ? ? ?There are no problems to display for this patient. ? ? ?Past Surgical History:  ?Procedure Laterality Date  ? KNEE SURGERY    ? ? ? ? ? ?Home Medications   ? ?Prior to Admission medications   ?Medication Sig Start Date End Date Taking? Authorizing Provider  ?albuterol (VENTOLIN HFA) 108 (90 Base) MCG/ACT inhaler Inhale 1-2 puffs into the lungs every 6 (six) hours as needed for wheezing or shortness of breath. 01/04/21   Bing Neighbors, FNP  ?cyclobenzaprine (FLEXERIL) 5 MG tablet Take 1 tablet (5 mg total) by mouth 2 (two) times daily as needed for muscle spasms. 01/26/22   Gustavus Bryant, FNP  ?hydrOXYzine (ATARAX/VISTARIL) 25 MG tablet Take 1 tablet (25 mg total) by mouth every 6 (six) hours. 07/13/16   Janne Napoleon, NP  ?ibuprofen (ADVIL) 600 MG tablet Take 1 tablet (600 mg total) by mouth every 6 (six) hours as needed for mild pain. 01/26/22   Gustavus Bryant, FNP  ?naproxen (NAPROSYN) 500 MG tablet Take 1 tablet (500 mg total) by mouth 2 (two) times daily. 02/22/21   Wieters, Hallie C, PA-C  ? ? ?Family History ?Family History  ?Problem Relation Age of Onset  ? Healthy Mother   ? Healthy Father   ? ? ?Social History ?Social History  ? ?Tobacco Use  ? Smoking status: Former  ? Smokeless tobacco: Never  ?Vaping Use  ?  Vaping Use: Never used  ?Substance Use Topics  ? Alcohol use: Yes  ?  Comment: occ  ? Drug use: No  ? ? ? ?Allergies   ?Mustard seed ? ? ?Review of Systems ?Review of Systems ?As per HPI ? ?Physical Exam ?Triage Vital Signs ?ED Triage Vitals  ?Enc Vitals Group  ?   BP 02/03/22 1924 126/77  ?   Pulse Rate 02/03/22 1924 78  ?   Resp 02/03/22 1924 17  ?   Temp 02/03/22 1924 98.7 ?F (37.1 ?C)  ?   Temp Source 02/03/22 1924 Oral  ?   SpO2 02/03/22 1924 94 %  ?   Weight --   ?   Height --   ?   Head Circumference --   ?   Peak Flow --   ?   Pain Score 02/03/22 1922 6  ?   Pain Loc --   ?   Pain Edu? --   ?   Excl. in GC? --   ? ?No data found. ? ?Updated Vital Signs ?BP 126/77   Pulse 78   Temp 98.7 ?F (37.1 ?C) (Oral)   Resp 17   SpO2 94%  ? ?Visual Acuity ?Right  Eye Distance:   ?Left Eye Distance:   ?Bilateral Distance:   ? ?Right Eye Near:   ?Left Eye Near:    ?Bilateral Near:    ? ?Physical Exam ?Vitals and nursing note reviewed.  ?Constitutional:   ?   General: He is not in acute distress. ?   Appearance: Normal appearance. He is not ill-appearing.  ?HENT:  ?   Right Ear: Tympanic membrane normal.  ?   Left Ear: Tympanic membrane normal.  ?Cardiovascular:  ?   Rate and Rhythm: Normal rate and regular rhythm.  ?   Pulses: Normal pulses.  ?   Heart sounds: Normal heart sounds.  ?Musculoskeletal:     ?   General: No swelling or tenderness. Normal range of motion.  ?   Cervical back: Normal range of motion. No rigidity or tenderness.  ?Neurological:  ?   Mental Status: He is alert.  ? ? ? ?UC Treatments / Results  ?Labs ?(all labs ordered are listed, but only abnormal results are displayed) ?Labs Reviewed - No data to display ? ?EKG ? ? ?Radiology ?No results found. ? ?Procedures ?Procedures (including critical care time) ? ?Medications Ordered in UC ?Medications - No data to display ? ?Initial Impression / Assessment and Plan / UC Course  ?I have reviewed the triage vital signs and the nursing notes. ? ?Pertinent  labs & imaging results that were available during my care of the patient were reviewed by me and considered in my medical decision making (see chart for details). ? ?  ? ?1.  Chronic left shoulder and mid back pain: ?Patient has been given work excuse from the urgent care on 2 prior occasions.  I explained to the patient that he will need to follow-up with orthopedic surgery to have a comprehensive evaluation and possibly with occupational health if his initial injury was work-related.  I explained to the patient that normal work excuse will be given from the urgent care. ?Given 2 days off with a return date on Sunday.  Normal work excuse should be given if the patient comes back for the same complaint AND requests further extension of his work excuse. ?Final Clinical Impressions(s) / UC Diagnoses  ? ?Final diagnoses:  ?Left shoulder pain, unspecified chronicity  ?Mid back pain, chronic  ? ? ? ?Discharge Instructions   ? ?  ?Please follow-up with Delbert Harness as scheduled. ? ? ? ?ED Prescriptions   ?None ?  ? ?PDMP not reviewed this encounter. ?  ?Merrilee Jansky, MD ?02/04/22 0025 ? ?

## 2023-10-20 ENCOUNTER — Encounter: Payer: Self-pay | Admitting: Pulmonary Disease

## 2024-01-23 ENCOUNTER — Ambulatory Visit

## 2024-01-23 ENCOUNTER — Ambulatory Visit
Admission: RE | Admit: 2024-01-23 | Discharge: 2024-01-23 | Disposition: A | Discharge status: Home / Self Care | Source: Ambulatory Visit | Attending: Family Medicine | Admitting: Family Medicine

## 2024-01-23 VITALS — BP 134/79 | HR 64 | Temp 98.5°F | Resp 20

## 2024-01-23 DIAGNOSIS — J309 Allergic rhinitis, unspecified: Secondary | ICD-10-CM

## 2024-01-23 DIAGNOSIS — J018 Other acute sinusitis: Secondary | ICD-10-CM

## 2024-01-23 DIAGNOSIS — J4530 Mild persistent asthma, uncomplicated: Secondary | ICD-10-CM | POA: Diagnosis not present

## 2024-01-23 DIAGNOSIS — I509 Heart failure, unspecified: Secondary | ICD-10-CM | POA: Diagnosis not present

## 2024-01-23 HISTORY — DX: Cerebral infarction, unspecified: I63.9

## 2024-01-23 HISTORY — DX: Heart failure, unspecified: I50.9

## 2024-01-23 HISTORY — DX: Acute myocardial infarction, unspecified: I21.9

## 2024-01-23 LAB — POCT RAPID STREP A (OFFICE): Rapid Strep A Screen: NEGATIVE

## 2024-01-23 MED ORDER — CETIRIZINE HCL 10 MG PO TABS: 10.0000 mg | ORAL_TABLET | Freq: Every day | ORAL | 0 refills | Status: AC

## 2024-01-23 MED ORDER — PROMETHAZINE-DM 6.25-15 MG/5ML PO SYRP: 5.0000 mL | ORAL_SOLUTION | Freq: Three times a day (TID) | ORAL | 0 refills | Status: AC | PRN

## 2024-01-23 MED ORDER — ALBUTEROL SULFATE HFA 108 (90 BASE) MCG/ACT IN AERS: 1.0000 | INHALATION_SPRAY | Freq: Four times a day (QID) | RESPIRATORY_TRACT | 0 refills | Status: AC | PRN

## 2024-01-23 MED ORDER — AMOXICILLIN 875 MG PO TABS: 875.0000 mg | ORAL_TABLET | Freq: Two times a day (BID) | ORAL | 0 refills | Status: AC

## 2024-01-23 NOTE — ED Provider Notes (Signed)
 [facility]  Note:  This document was prepared using Conservation officer, historic buildings and may include unintentional dictation errors.  MRN: [id number] DOB: [birth year]  Subjective:   Nathan Schick. is a 33 y.o. male presenting for 8-9 day history of sinus congestion, sinus drainage, throat pain, painful swallowing, coughing.  Has had significant difficulty performing his work tasks.  Has a history of sinus infections per patient.  Medical history significant for stroke, congestive heart failure, MI.  Last echocardiogram showed an ejection fraction of 30% to 35% from October 2024.  He is very compliant with his heart medications.  Denies any particular chest pain, shortness of breath or wheezing currently.  However, he does have asthma and has felt some chest tightness.  No current facility-administered medications for this encounter.  Current Outpatient Medications:    apixaban (ELIQUIS) 5 MG TABS tablet, Take by mouth., Disp: , Rfl:    metoprolol succinate (TOPROL-XL) 25 MG 24 hr tablet, Take 0.5 tablets by mouth daily., Disp: , Rfl:    albuterol (VENTOLIN HFA) 108 (90 Base) MCG/ACT inhaler, Inhale 1-2 puffs into the lungs every 6 (six) hours as needed for wheezing or shortness of breath., Disp: 8 g, Rfl: 0   atorvastatin (LIPITOR) 80 MG tablet, SMARTSIG:1 Tablet(s) By Mouth Every Evening, Disp: , Rfl:    cyclobenzaprine (FLEXERIL) 5 MG tablet, Take 1 tablet (5 mg total) by mouth 2 (two) times daily as needed for muscle spasms., Disp: 20 tablet, Rfl: 0   ENTRESTO 24-26 MG, Take 1 tablet by mouth 2 (two) times daily., Disp: , Rfl:    hydrOXYzine (ATARAX/VISTARIL) 25 MG tablet, Take 1 tablet (25 mg total) by mouth every 6 (six) hours., Disp: 20 tablet, Rfl: 0   ibuprofen (ADVIL) 600 MG tablet, Take 1 tablet (600 mg total) by mouth every 6 (six) hours as needed for mild pain., Disp: 30 tablet, Rfl: 0   JARDIANCE 10 MG TABS tablet, Take 10 mg by mouth daily., Disp: ,  Rfl:    naproxen (NAPROSYN) 500 MG tablet, Take 1 tablet (500 mg total) by mouth 2 (two) times daily., Disp: 30 tablet, Rfl: 0   sertraline (ZOLOFT) 50 MG tablet, Take 50 mg by mouth daily., Disp: , Rfl:    spironolactone (ALDACTONE) 25 MG tablet, Take 25 mg by mouth daily., Disp: , Rfl:    Allergies  Allergen Reactions   Mustard     Past Medical History:  Diagnosis Date   Allergy    Asthma    Heart attack (HCC)    per pt   Heart failure (HCC)    per pt   Stroke [facility])      Past Surgical History:  Procedure Laterality Date   CORONARY STENT INTERVENTION     per pt   KNEE SURGERY      Family History  Problem Relation Age of Onset   Healthy Mother    Healthy Father     Social History   Tobacco Use   Smoking status: Former    Types: Cigarettes   Smokeless tobacco: Never  Vaping Use   Vaping status: Former  Substance Use Topics   Alcohol use: Not Currently   Drug use: No    ROS   Objective:   Vitals: BP 134/79 (BP Location: Right Arm)   Pulse 64   Temp 98.5 F (36.9 C) (Oral)   Resp 20   SpO2 96%   Physical Exam Constitutional:  General: He is not in acute distress.    Appearance: Normal appearance. He is well-developed and normal weight. He is not ill-appearing, toxic-appearing or diaphoretic.  HENT:     Head: Normocephalic and atraumatic.     Right Ear: Tympanic membrane, ear canal and external ear normal. No drainage, swelling or tenderness. No middle ear effusion. There is no impacted cerumen. Tympanic membrane is not erythematous or bulging.     Left Ear: Tympanic membrane, ear canal and external ear normal. No drainage, swelling or tenderness.  No middle ear effusion. There is no impacted cerumen. Tympanic membrane is not erythematous or bulging.     Nose: Congestion and rhinorrhea present.     Mouth/Throat:     Mouth: Mucous membranes are moist.     Pharynx: No pharyngeal swelling, oropharyngeal exudate, posterior oropharyngeal erythema or  uvula swelling.     Tonsils: No tonsillar exudate or tonsillar abscesses. 0 on the right. 0 on the left.  Eyes:     General: No scleral icterus.       Right eye: No discharge.        Left eye: No discharge.     Extraocular Movements: Extraocular movements intact.     Conjunctiva/sclera: Conjunctivae normal.  Cardiovascular:     Rate and Rhythm: Normal rate and regular rhythm.     Heart sounds: Normal heart sounds. No murmur heard.    No friction rub. No gallop.  Pulmonary:     Effort: Pulmonary effort is normal. No respiratory distress.     Breath sounds: Normal breath sounds. No stridor. No wheezing, rhonchi or rales.  Musculoskeletal:     Cervical back: Normal range of motion and neck supple. No rigidity. No muscular tenderness.  Neurological:     General: No focal deficit present.     Mental Status: He is alert and oriented to person, place, and time.  Psychiatric:        Mood and Affect: Mood normal.        Behavior: Behavior normal.        Thought Content: Thought content normal.        Judgment: Judgment normal.     Results for orders placed or performed during the hospital encounter of 01/23/24 (from the past 24 hours)  POCT rapid strep A     Status: Normal   Collection Time: 01/23/24  2:07 PM  Result Value Ref Range   Rapid Strep A Screen Negative    Echocardiogram Follow Up/Limited Echo With Contrast  Anatomical Region Laterality Modality  Chest -- Ultrasound  Vascular -- --   Narrative  Patient Info Name:     Nathan Chaney Age:     32 years DOB:     1991/01/11 Gender:     Male MRN:     [id number] Accession #:     [id number] [facility] Account #:     [id number] Ht:     194 cm Wt:     150 kg BSA:     2.90 m2 BP:     133 /     80 mmHg Exam Date:     08/11/2023 10:00 AM Admit Date:     08/11/2023  Exam Type:     ECHOCARDIOGRAM FOLLOW UP/LIMITED ECHO WITH CONTRAST  Technical Quality:     Fair  Staff Sonographer:     Bing Ree Referring  Physician:     Lorretta Harp MD  Study Info Indications      - limited eval EF,  use contrast to eval apical thrombus ; Definity/Optison     I24.0 - LV (left ventricular) mural thrombus without MI (CMS-HCC)     I25.10 - Coronary artery disease involving native coronary artery of native heart without angina pectoris     I24.0 - Ischemic heart disease due to coronary artery obstruction (CMS-HCC)     I25.9 - Ischemic heart disease due to coronary artery obstruction (CMS-HCC)     I25.5 - Ischemic cardiomyopathy Procedure(s)   Limited Empty transthoracic echocardiogram is performed with contrast to opacify the left ventricle and to improve the delineation of the left ventricle endocardial borders.   Summary   1. The left ventricle is moderately dilated in size with normal wall thickness.   2. The left ventricular systolic function is severely decreased, LVEF is visually estimated at 30-35%.   3. The right ventricle is normal in size, with normal systolic function.   4. Limited study to assess ventricular function.  Assessment and Plan :   PDMP not reviewed this encounter.  1. Acute non-recurrent sinusitis of other sinus   2. Allergic rhinitis, unspecified seasonality, unspecified trigger   3. Congestive heart failure, unspecified HF chronicity, unspecified heart failure type (HCC)   4. Mild persistent asthma, uncomplicated    Given timeline of illness and trajectory recommended empiric treatment for sinusitis with amoxicillin.  Use supportive care otherwise.  Refilled his albuterol inhaler.  Will defer imaging for now.  Counseled patient on potential for adverse effects with medications prescribed/recommended today, ER and return-to-clinic precautions discussed, patient verbalized understanding.    Wallis Bamberg, [place] 01/23/24 1510

## 2024-01-23 NOTE — Discharge Instructions (Addendum)
 We will manage this as a sinus infection with amoxicillin. For sore throat or cough try using a honey-based tea. Use 3 teaspoons of honey with juice squeezed from half lemon. Place shaved pieces of ginger into 1/2-1 cup of water and warm over stove top. Then mix the ingredients and repeat every 4 hours as needed. Please take Tylenol 500mg -650mg  every 6 hours for throat pain, fevers, aches and pains. Hydrate very well with at least 2 liters of water. Eat light meals such as soups (chicken and noodles, vegetable, chicken and wild rice).  Do not eat foods that you are allergic to.  Taking an antihistamine like Zyrtec can help against postnasal drainage, sinus congestion which can cause sinus pain, sinus headaches, throat pain, painful swallowing, coughing.  You can take this together with your albuterol inhaler.  Use cough medication as needed.

## 2024-01-23 NOTE — ED Triage Notes (Signed)
 Pt c/o sore throat x 4-5 days-denies known fever-NAD-steady gait
